# Patient Record
Sex: Male | Born: 1965
Health system: Southern US, Community
[De-identification: ages and names within clinical notes are randomized; demographics above are authoritative.]

## PROBLEM LIST (undated history)

## (undated) DIAGNOSIS — K219 Gastro-esophageal reflux disease without esophagitis: Secondary | ICD-10-CM

## (undated) DIAGNOSIS — K2950 Unspecified chronic gastritis without bleeding: Secondary | ICD-10-CM

## (undated) DIAGNOSIS — K449 Diaphragmatic hernia without obstruction or gangrene: Secondary | ICD-10-CM

## (undated) DIAGNOSIS — IMO0002 Reserved for concepts with insufficient information to code with codable children: Secondary | ICD-10-CM

## (undated) DIAGNOSIS — T7840XA Allergy, unspecified, initial encounter: Secondary | ICD-10-CM

## (undated) HISTORY — DX: Unspecified chronic gastritis without bleeding: K29.50

## (undated) HISTORY — DX: Diaphragmatic hernia without obstruction or gangrene: K44.9

## (undated) HISTORY — DX: Allergy, unspecified, initial encounter: T78.40XA

## (undated) HISTORY — DX: Reserved for concepts with insufficient information to code with codable children: IMO0002

## (undated) HISTORY — DX: Gastro-esophageal reflux disease without esophagitis: K21.9

## (undated) HISTORY — PX: UPPER GASTROINTESTINAL ENDOSCOPY: SHX188

---

## 1998-04-05 ENCOUNTER — Ambulatory Visit (HOSPITAL_COMMUNITY): Admission: RE | Admit: 1998-04-05 | Discharge: 1998-04-05 | Payer: Self-pay | Admitting: *Deleted

## 2002-07-21 ENCOUNTER — Ambulatory Visit (HOSPITAL_COMMUNITY): Admission: RE | Admit: 2002-07-21 | Discharge: 2002-07-21 | Payer: Self-pay | Admitting: Gastroenterology

## 2002-07-21 ENCOUNTER — Encounter: Payer: Self-pay | Admitting: Gastroenterology

## 2004-06-22 HISTORY — PX: BACK SURGERY: SHX140

## 2004-10-31 ENCOUNTER — Encounter
Admission: RE | Admit: 2004-10-31 | Discharge: 2004-10-31 | Payer: Self-pay | Admitting: Physical Medicine and Rehabilitation

## 2005-01-29 ENCOUNTER — Inpatient Hospital Stay (HOSPITAL_COMMUNITY): Admission: RE | Admit: 2005-01-29 | Discharge: 2005-02-01 | Payer: Self-pay | Admitting: Orthopaedic Surgery

## 2006-03-15 ENCOUNTER — Encounter: Admission: RE | Admit: 2006-03-15 | Discharge: 2006-03-15 | Payer: Self-pay | Admitting: Orthopaedic Surgery

## 2007-01-06 ENCOUNTER — Ambulatory Visit (HOSPITAL_COMMUNITY): Admission: RE | Admit: 2007-01-06 | Discharge: 2007-01-06 | Payer: Self-pay | Admitting: Family Medicine

## 2007-09-23 ENCOUNTER — Encounter: Admission: RE | Admit: 2007-09-23 | Discharge: 2007-09-23 | Payer: Self-pay | Admitting: Orthopaedic Surgery

## 2010-11-07 NOTE — Op Note (Signed)
NAMEAMARRI, SATTERLY               ACCOUNT NO.:  [id number]   MEDICAL RECORD NO.:  [id number]          PATIENT TYPE:  INP   LOCATION:  5041                         FACILITY:  [facility]   PHYSICIAN:  Sharolyn Douglas, M.D.        DATE OF BIRTH:  08-Sep-1965   DATE OF PROCEDURE:  01/29/2005  DATE OF DISCHARGE:                                 OPERATIVE REPORT   DIAGNOSIS:  L5-S1 degenerative disk disease, back and bilateral leg pain  left greater than right.   PROCEDURE:  1.  L5-S1 hemilaminotomy and diskectomy.  2.  Transforaminal lumbar interbody fusion L5-S1 placement of 9 mm PEEK      cage.  3.  Pedicle screw instrumentation L5-S1 using the Abbott spine system.  4.  Posterior spinal arthrodesis L5-S1.  5.  Local autogenous bone graft supplemented with bone morphogenic protein.   SURGEON:  Sharolyn Douglas, M.D.   ASSISTANT:  Verlin Fester, P.A.   ANESTHESIA:  General endotracheal.   ESTIMATED BLOOD LOSS:  200 mL.   COMPLICATIONS:  None.   INDICATIONS:  The patient is a pleasant 45 year old male with chronic  progressive back and leg pain. He has a radiographic studies including MRI,  plain x-rays and CT diskogram which demonstrate degenerative disk disease at  L5-S1 with concordant pain and lateral recess narrowing.  The superadjacent  segments are preserved. Because of his chronic progressive pain. He is  elected to undergo lumbar decompression and fusion in hopes of improving his  symptoms. Risk and benefits were reviewed.   PROCEDURE:  The patient was identified in the holding area and taken to the  operating room. He underwent general endotracheal anesthesia without  difficulty given prophylactic IV antibiotics. Carefully positioned prone on  the Wilson frame. All bony prominences padded. Face and eyes protected at  all times.  Back prepped, draped usual sterile fashion. Midline incision  made over the L4-S1 level. Dissection was carried sharply through deep  fascia. Paraspinal  muscles elevated out over the tips of the transverse  processes of L5 and sacral ala bilaterally. Deep retractors placed.  Intraoperative x-ray taken to confirm location. Hemilaminotomy was completed  on the left side by removing the inferior one third of the L5 lamina and  medial one third of the facet joint complex.  The ligamentum flavum was  removed piecemeal. The L5 and S1 nerve roots were identified.  The thecal  sac was mobilized medially. A focal disk herniation was identified. The disk  space was entered and the herniation was decompressed back into the  interspace and removed with a pituitary rongeur. The lateral recess was  decompressed flush with the L5 pedicle. We then turned our attention to  placing pedicle screws at L5 and S1 using anatomic probing technique. Each  pedicle starting point was found using the posterior anatomy. The pedicle  starting holes were initiated with the awl. Steffee pedicle probe used to  cannulate the pedicles. The pedicles were then tapped. We utilized 6.5 x 50  mm screws at L5, 7.5 x 30 mm screws in the sacrum bilaterally. Each pedicle  was palpated with a ball-tip feeler before placing the screws and there were  no breeches. The screw purchase was good. Each screw was then stimulated  using triggered EMGs and there were no deleterious changes. We then  completed the posterior arthrodesis by decorticating the transverse  processes of L5 and sacral ala bilaterally. Local bone graft obtained from  the laminotomy was cleaned and morselized. This was packed into the lateral  gutters. The L5-S1 foramen was palpated with the hockey-stick probe and it  was felt that there was an element of up down stenosis. In order to increase  the fusion rate and indirectly distract the foramen, it was elected to  proceed with transforaminal lumbar interbody fusion. The remaining facet  joint on the left side was osteotomized. The L5-S1 nerve roots were  protected all  times. Free running EMGs were monitored. The disk space was  entered and radical diskectomy was carried out across the contralateral  side. The cartilaginous endplates were scraped with curved curettes. The  disk space was distracted. Packed with local bone graft as well as BMP  sponges from the medium InFuse kit. 9 mm PEEK cage was then packed with a  BMP sponge inserted into the interspace, tamped anteriorly and across the  midline. We then placed short segment titanium rods into the polyaxial screw  heads. Gentle compression was applied before shearing off the locking caps.  The wound was irrigated. Hemostasis was achieved. Deep drain left in place.  Deep fascia closed with a running #1 Vicryl suture. Subcutaneous layer  closed with 2-0 Vicryl followed by a running 3-0 subcuticular Vicryl suture  on the skin edges. Benzoin, Steri-Strips placed. Sterile dressing applied.  The patient was turned supine, extubated without difficulty and transferred  to recovery in stable condition able to move his upper and lower  extremities.      Sharolyn Douglas, M.D.  Electronically Signed     MC/MEDQ  D:  01/29/2005  T:  01/30/2005  Job:  [id number]

## 2010-11-07 NOTE — Discharge Summary (Signed)
Jonathon Gray, Jonathon Gray               ACCOUNT NO.:  [id number]   MEDICAL RECORD NO.:  [id number]          PATIENT TYPE:  INP   LOCATION:  5041                         FACILITY:  [facility]   PHYSICIAN:  Sharolyn Douglas, M.D.        DATE OF BIRTH:  [birth year]   DATE OF ADMISSION:  01/29/2005  DATE OF DISCHARGE:  02/01/2005                                 DISCHARGE SUMMARY   ADMITTING DIAGNOSES:  L5-S1 degenerative disk disease with severe pain  refractory to conservative care.   DISCHARGE DIAGNOSES:  1.  Status post L5-S1 posterior spinal fusion with pedicle screws, doing      well.  2.  Postoperative blood loss anemia.  3.  Postoperative hyperglycemia.   PROCEDURE:  On January 29, 2005 patient was taken to the operating room for  an L5-S1 posterior spinal fusion with pedicle screws, TLIF and BMP.  Surgery  was done by Sharolyn Douglas, M.D., assistant [facility].  Anesthesia  was general.   CONSULTS:  None.   LABORATORIES:  Preoperatively CBC with differential was within normal  limits.  PT/INR and PTT within normal limits.  Complete metabolic panel  within normal limits.  Postoperatively H&H was monitored on a daily basis x3  days.  Did reach a low of 13.2 and 38.7 on August 13, however, asymptomatic  not requiring any treatment.  Basic metabolic panel x2 days postoperatively  was within normal limits with the exception of glucose of 139 on January 30, 2005 and of 130 on August 12 along with a BUN of 4 on January 31, 2005.  Blood typing from August 4 of 2006 was type O.  Rh type negative.  Antibody  screen negative.  EKG from August 4 showed sinus bradycardia with sinus  arrhythmia, minimal voltage criteria for LVH and AV normal variant.  No  previous tracing.  Read by Aggie Cosier, M.D.  X-rays:  Portable x-rays were  used intraoperatively for localization and placement of pedicle screws at L5  and S1 levels.   BRIEF HISTORY:  Patient is a 45 year old male who has had ongoing problems  related to his back and bilateral lower extremities.  He has tried numerous  conservative treatments recommended by Dr. Otelia Sergeant as well as Dr. Noel Gerold and  failed to improve for any length of time with these treatments.  He was  found to have L5-S1 significant degenerative changes as well as severe pain.  Diskogram confirmed as being a painful level.  Risks and benefits of L5-S1  fusion were discussed at length by Dr. Noel Gerold with the patient as well as his  wife.  He had understanding and opted to proceed with surgery.   HOSPITAL COURSE:  Patient was admitted on January 29, 2005 and taken to the  operating room for the above listed procedure.  He tolerated procedure well  without any intraoperative complications.  He was transferred to recovery  room in stable condition.  Intraoperative blood loss was approximately 200  mL.   Postoperatively routine orthopedic spine protocol was followed.  He  progressed along extremely well and did  not develop any significant  postoperative complications.   Physical therapy worked with him on a daily basis on his brace use, home  back precautions as well as ambulation, independence, and safety.  He  progressed along extremely well with them.   By February 01, 2005 patient had met all orthopedic goals.  He was stable and  independent with ambulation and ADLs.  He understood his brace use and back  precautions and demonstrated understanding of these.   DISCHARGE PLAN:  Patient is a 45 year old male status post L5-S1 posterior  spinal fusion doing well.   ACTIVITY:  Daily ambulation.  Brace on when he is on his feet.  Back  precautions at all times.  No lifting heavier than 5 pounds.  Dressing  changes daily.  Keep incision dry until all drainage stops at which time he  may resume showering.  Follow up two weeks postoperatively with Dr. Noel Gerold.  Diet is regular home diet as tolerated.   CONDITION ON DISCHARGE:  Stable, improved.   DISPOSITION:  Patient  being discharged to his home with his family's  assistance as well as home health physical therapy.      Verlin Fester, P.A.      Sharolyn Douglas, M.D.  Electronically Signed    CM/MEDQ  D:  05/20/2005  T:  05/20/2005  Job:  [id number]

## 2010-11-07 NOTE — H&P (Signed)
Jonathon Gray, Jonathon Gray               ACCOUNT NO.:  [id number]   MEDICAL RECORD NO.:  [id number]          PATIENT TYPE:  INP   LOCATION:  NA                           FACILITY:  [facility]   PHYSICIAN:  Sharolyn Douglas, M.D.        DATE OF BIRTH:  December 13, 1965   DATE OF ADMISSION:  01/29/2005  DATE OF DISCHARGE:                                HISTORY & PHYSICAL   CHIEF COMPLAINT:  Back and bilateral lower extremity pain.   HISTORY:  Patient is a 45 year old male who has been having increasing back  and bilateral lower extremity pain for several months.  He has tried  numerous successive conservative treatments that were recommended by Dr.  Otelia Sergeant as well as Dr. Noel Gerold and failed to improve for any length of time with  any of these.  He has undergone a diskogram which did indicate that he was  having pain from his L5-S1 disk.  His pain is interfering with activities of  daily living.  His quality of life has suffered significantly.  It is  thought the best course of management because of his testing as well as his  failure to improve with conservative management would be an L5-S1 posterior  spinal fusion procedure.  The risks and benefits of this were discussed with  him by Dr. Noel Gerold as well as myself.  He indicated understanding and opted to  proceed.   PAST MEDICAL HISTORY:  Healthy.   PAST SURGICAL HISTORY:  None.   ALLERGIES:  None.   MEDICATIONS:  Darvocet and Arthrotec.   REVIEW OF SYSTEMS:  Patient denies any fevers, chills, sweats, or bleeding  tendencies.  CNS:  Denies blurred vision, double vision, seizures, headache,  paralysis.  CARDIOVASCULAR:  Denies chest pain, angina, orthopnea,  claudication, palpitations.  PULMONARY:  Denies shortness of breath,  productive cough, or hemoptysis.  GI:  Denies nausea, vomiting,  constipation, diarrhea, melena, or bloody stool.  GU:  Denies dysuria,  hematuria, discharge.  MUSCULOSKELETAL:  As per HPI.   PHYSICAL EXAMINATION:  VITAL SIGNS:   Pulse is 62 and regular, respirations  16 and unlabored, blood pressure 120/76.  GENERAL:  Patient is a 45 year old white male who is alert and oriented, in  no acute distress.  Well-nourished, well-groomed.  Appears stated age.  Pleasant, cooperative on examination.  NECK:  Soft to palpation.  No lymphadenopathy or thyromegaly noted.  LUNGS:  Clear to auscultation bilaterally.  No rales, rhonchi, strider,  wheezing.  No friction rubs.  BREASTS:  Not pertinent.  Not performed.  HEART:  S1, S2.  Regular rate and rhythm.  No murmurs, gallops, rubs noted.  ABDOMEN:  Soft to palpation.  Nontender.  Nondistended.  No organomegaly  noted.  GENITOURINARY:  Not pertinent.  Not performed.  EXTREMITIES:  Bilateral lower extremity pain.  Motor function is intact.  Sensation is intact grossly.  SKIN:  Intact without any lesions or rashes.  NEUROLOGIC:  Intact grossly.   X-ray shows degenerative disk disease and MRI shows foraminal stenosis L4-L5-  S1.   IMPRESSION:  Degenerative disk disease and foraminal stenosis  L5-S1.   PLAN:  1.  Admit to [facility] on January 30, 2004 for an L5-S1 posterior      spinal fusion with pedicle screws.  This will be done by Dr. Sharolyn Douglas.  2.  Patient's primary care doctor is Dr. Renard Matter and has given the patient      verbal clearance.  We are still awaiting the written clearance from him.       CM/MEDQ  D:  01/26/2005  T:  01/26/2005  Job:  [id number]

## 2011-07-08 ENCOUNTER — Encounter (HOSPITAL_COMMUNITY): Payer: Self-pay | Admitting: Pharmacy Technician

## 2011-07-08 NOTE — Consult Note (Signed)
Jonathon Gray, Jonathon Gray               ACCOUNT NO.:  [id number]  MEDICAL RECORD NO.:  [id number]  LOCATION:                                 FACILITY:  PHYSICIAN:  Barbaraann Barthel, M.D. DATE OF BIRTH:  1966/05/20  DATE OF CONSULTATION:  07/07/2011 DATE OF DISCHARGE:                                CONSULTATION   DIAGNOSIS:  Left inguinal hernia.  HISTORY OF PRESENT MEDICAL ILLNESS:  The patient states that he was doing some heavy lifting and he felt a pull and a tear which was later followed by left groin pain and swelling in the area of the groin.  He was seen first by Dr. Renard Matter, who referred him to my office for repair of left inguinal hernia.  Physical examination revealed a non- incarcerated left inguinal hernia.  PAST MEDICAL HISTORY:  Fairly unremarkable.  PAST SURGICAL HISTORY:  He had an L5-S1 fusion in August of 2006.  For medications, see medication list.  ALLERGIES:  He has no known allergies.  SOCIAL HISTORY:  He does not smoke or drink.  PHYSICAL EXAMINATION:  VITAL SIGNS:  He is 5 feet 10 inches, weighs 180 pounds, his temperature is 97.5, pulse rate 60, respirations 12, blood pressure 110/70. HEAD:  Normocephalic. EYES:  Extraocular movements are intact.  Pupils were round reactive to light and accommodation.  There is no conjunctival pallor or scleral injection.  No adenopathy.  No bruits are auscultated. CHEST:  Clear, both to anterior and posterior auscultation. HEART:  Regular rhythm. ABDOMEN:  Soft.  The patient has a left inguinal hernia. GENITALIA:  Within normal limits. RECTAL:  Guaiac-negative stool.  Prostate is smooth without any nodules. EXTREMITIES:  Within normal limits.  REVIEW OF SYSTEMS:  NEUROLOGIC SYSTEM:  No history of migraines or seizures.  ENDOCRINE SYSTEM:  No history of diabetes or thyroid disease. CARDIOPULMONARY SYSTEM:  No shortness of breath, chest pain, or history of any myocardial infarction.  MUSCULOSKELETAL SYSTEM:  Recent  back surgery in 2006 with fusion of L5-L6, chronic back pain. GI SYSTEM:  No past history of hepatitis, constipation, diarrhea, bright red rectal bleeding, black tarry stools, inflammatory bowel disease, irritable bowel syndrome, or unexplained weight loss.  The patient has never undergone a colonoscopy. GU SYSTEM:  No history of frequency, dysuria, or nephrolithiasis.  DIAGNOSIS:  Therefore, left inguinal hernia.  PLAN:  Repair of left inguinal hernia via the outpatient department.  We have discussed surgery with him discussing complications not limited to, but including bleeding, infection, and recurrence, and  informed consent was obtained.     Barbaraann Barthel, M.D.     WB/MEDQ  D:  07/07/2011  T:  07/07/2011  Job:  [id number]  cc:   [name], MD Fax: [phone]  Short-Stay Department

## 2011-07-09 ENCOUNTER — Encounter (HOSPITAL_COMMUNITY): Admission: RE | Admit: 2011-07-09 | Source: Ambulatory Visit

## 2011-07-14 ENCOUNTER — Encounter (HOSPITAL_COMMUNITY): Payer: Self-pay

## 2011-07-14 ENCOUNTER — Encounter (HOSPITAL_COMMUNITY)
Admission: RE | Admit: 2011-07-14 | Discharge: 2011-07-14 | Disposition: A | Discharge status: Home / Self Care | Source: Ambulatory Visit | Attending: General Surgery | Admitting: General Surgery

## 2011-07-14 LAB — CBC
HCT: 42.5 % (ref 39.0–52.0)
MCV: 86.4 fL (ref 78.0–100.0)
Platelets: 241 10*3/uL (ref 150–400)
RBC: 4.92 MIL/uL (ref 4.22–5.81)
RDW: 11.9 % (ref 11.5–15.5)
WBC: 5.9 10*3/uL (ref 4.0–10.5)

## 2011-07-14 LAB — DIFFERENTIAL
Basophils Absolute: 0 10*3/uL (ref 0.0–0.1)
Eosinophils Relative: 2 % (ref 0–5)
Lymphocytes Relative: 31 % (ref 12–46)
Lymphs Abs: 1.8 10*3/uL (ref 0.7–4.0)
Monocytes Absolute: 0.5 10*3/uL (ref 0.1–1.0)
Neutro Abs: 3.5 10*3/uL (ref 1.7–7.7)

## 2011-07-14 LAB — BASIC METABOLIC PANEL
CO2: 32 mEq/L (ref 19–32)
Chloride: 102 mEq/L (ref 96–112)
Glucose, Bld: 96 mg/dL (ref 70–99)
Sodium: 139 mEq/L (ref 135–145)

## 2011-07-14 LAB — SURGICAL PCR SCREEN: Staphylococcus aureus: NEGATIVE

## 2011-07-14 NOTE — Patient Instructions (Addendum)
20 Jonathon Gray  07/14/2011   Your procedure is scheduled on:  07/20/11  Report to [facility] at 09:10 AM.  Call this number if you have problems the morning of surgery: [phone]   Remember:   Do not eat food:After Midnight.  May have clear liquids:until Midnight .  Clear liquids include soda, tea, black coffee, apple or grape juice, broth.  Take these medicines the morning of surgery with A SIP OF WATER: Meloxicam. Also, take your Hydrocodone only if needed.   Do not wear jewelry, make-up or nail polish.  Do not wear lotions, powders, or perfumes. You may wear deodorant.  Do not shave 48 hours prior to surgery.  Do not bring valuables to the hospital.  Contacts, dentures or bridgework may not be worn into surgery.  Leave suitcase in the car. After surgery it may be brought to your room.  For patients admitted to the hospital, checkout time is 11:00 AM the day of discharge.   Patients discharged the day of surgery will not be allowed to drive home.  Name and phone number of your driver:   Special Instructions: CHG Shower Use Special Wash: 1/2 bottle night before surgery and 1/2 bottle morning of surgery.   Please read over the following fact sheets that you were given: Pain Booklet, MRSA Information, Surgical Site Infection Prevention, Anesthesia Post-op Instructions and Care and Recovery After Surgery    Hernia, Surgical Repair A hernia occurs when an internal organ pushes out through a weak spot in the belly (abdominal) wall muscles. Hernias commonly occur in the groin and around the navel. Hernias often can be pushed back into place (reduced). Most hernias tend to get worse over time. Problems occur when abdominal contents get stuck in the opening (incarcerated hernia). The blood supply gets cut off (strangulated hernia). This is an emergency and needs surgery. Otherwise, hernia repair can be an elective procedure. This means you can schedule this at your convenience when an  emergency is not present. Because complications can occur, if you decide to repair the hernia, it is best to do it soon. When it becomes an emergency procedure, there is increased risk of complications after surgery. CAUSES   Heavy lifting.   Obesity.   Prolonged coughing.   Straining to move your bowels.   Hernias can also occur through a cut (incision) by a surgeonafter an abdominal operation.  HOME CARE INSTRUCTIONS Before the repair:  Bed rest is not required. You may continue your normal activities, but avoid heavy lifting (more than 10 pounds) or straining. Cough gently. If you are a smoker, it is best to stop. Even the best hernia repair can break down with the continual strain of coughing.   Do not wear anything tight over your hernia. Do not try to keep it in with an outside bandage or truss. These can damage abdominal contents if they are trapped in the hernia sac.   Eat a normal diet. Avoid constipation. Straining over long periods of time to have a bowel movement will increase hernia size. It also can breakdown repairs. If you cannot do this with diet alone, laxatives or stool softeners may be used.  PRIOR TO SURGERY, SEEK IMMEDIATE MEDICAL CARE IF: You have problems (symptoms) of a trapped (incarcerated) hernia. Symptoms include:  An oral temperature above 102 F (38.9 C) develops, or as your caregiver suggests.   Increasing abdominal pain.   Feeling sick to your stomach(nausea) and vomiting.   You stop passing gas  or stool.   The hernia is stuck outside the abdomen, looks discolored, feels hard, or is tender.   You have any changes in your bowel habits or in the hernia that is unusual for you.  LET YOUR CAREGIVERS KNOW ABOUT THE FOLLOWING:  Allergies.   Medications taken including herbs, eye drops, over the counter medications, and creams.   Use of steroids (by mouth or creams).   Family or personal history of problems with anesthetics or Novocaine.    Possibility of pregnancy, if this applies.   Personal history of blood clots (thrombophlebitis).   Family or personal history of bleeding or blood problems.   Previous surgery.   Other health problems.  BEFORE THE PROCEDURE You should be present 1 hour prior to your procedure, or as directed by your caregiver.  AFTER THE PROCEDURE After surgery, you will be taken to the recovery area. A nurse will watch and check your progress there. Once you are awake, stable, and taking fluids well, you will be allowed to go home as long as there are no problems. Once home, an ice pack (wrapped in a light towel) applied to your operative site may help with discomfort. It may also keep the swelling down. Do not lift anything heavier than 10 pounds (4.55 kilograms). Take showers not baths. Do not drive while taking narcotics. Follow instructions as suggested by your caregiver.  SEEK IMMEDIATE MEDICAL CARE IF: After surgery:  There is redness, swelling, or increasing pain in the wound.   There is pus coming from the wound.   There is drainage from a wound lasting longer than 1 day.   An unexplained oral temperature above 102 F (38.9 C) develops.   You notice a foul smell coming from the wound or dressing.   There is a breaking open of a wound (edged not staying together) after the sutures have been removed.   You notice increasing pain in the shoulders (shoulder strap areas).   You develop dizzy episodes or fainting while standing.   You develop persistent nausea or vomiting.   You develop a rash.   You have difficulty breathing.   You develop any reaction or side effects to medications given.  MAKE SURE YOU:   Understand these instructions.   Will watch your condition.   Will get help right away if you are not doing well or get worse.  Document Released: 12/02/2000 Document Revised: 02/18/2011 Document Reviewed: 10/25/2007 [facility] Patient Information 2012 [place], [place].

## 2011-07-20 ENCOUNTER — Ambulatory Visit (HOSPITAL_COMMUNITY)
Admission: RE | Admit: 2011-07-20 | Discharge: 2011-07-21 | Disposition: A | Discharge status: Home / Self Care | Source: Ambulatory Visit | Attending: General Surgery | Admitting: General Surgery

## 2011-07-20 ENCOUNTER — Encounter (HOSPITAL_COMMUNITY): Payer: Self-pay | Admitting: *Deleted

## 2011-07-20 ENCOUNTER — Other Ambulatory Visit: Payer: Self-pay | Admitting: General Surgery

## 2011-07-20 ENCOUNTER — Encounter (HOSPITAL_COMMUNITY)
Admission: RE | Disposition: A | Discharge status: Home / Self Care | Payer: Self-pay | Source: Ambulatory Visit | Attending: General Surgery

## 2011-07-20 ENCOUNTER — Encounter (HOSPITAL_COMMUNITY): Payer: Self-pay | Admitting: Anesthesiology

## 2011-07-20 ENCOUNTER — Ambulatory Visit (HOSPITAL_COMMUNITY): Admitting: Anesthesiology

## 2011-07-20 DIAGNOSIS — Z01812 Encounter for preprocedural laboratory examination: Secondary | ICD-10-CM | POA: Insufficient documentation

## 2011-07-20 DIAGNOSIS — K4090 Unilateral inguinal hernia, without obstruction or gangrene, not specified as recurrent: Secondary | ICD-10-CM | POA: Insufficient documentation

## 2011-07-20 DIAGNOSIS — D176 Benign lipomatous neoplasm of spermatic cord: Secondary | ICD-10-CM | POA: Insufficient documentation

## 2011-07-20 HISTORY — PX: INGUINAL HERNIA REPAIR: SHX194

## 2011-07-20 SURGERY — REPAIR, HERNIA, INGUINAL, ADULT
Anesthesia: General | Site: Abdomen | Laterality: Left | Wound class: Clean

## 2011-07-20 MED ORDER — ACETAMINOPHEN 650 MG RE SUPP: 650.0000 mg | Freq: Four times a day (QID) | RECTAL | Status: DC | PRN

## 2011-07-20 MED ORDER — MELOXICAM 7.5 MG PO TABS
15.0000 mg | ORAL_TABLET | Freq: Every day | ORAL | Status: DC
  Administered 2011-07-20: 15 mg via ORAL
  Filled 2011-07-20 (×2): qty 2

## 2011-07-20 MED ORDER — ONDANSETRON HCL 4 MG/2ML IJ SOLN: 4.0000 mg | Freq: Four times a day (QID) | INTRAMUSCULAR | Status: DC | PRN

## 2011-07-20 MED ORDER — ONDANSETRON HCL 4 MG/2ML IJ SOLN
INTRAMUSCULAR | Status: AC
  Administered 2011-07-20: 4 mg via INTRAVENOUS
  Filled 2011-07-20: qty 2

## 2011-07-20 MED ORDER — MIDAZOLAM HCL 5 MG/5ML IJ SOLN
INTRAMUSCULAR | Status: DC | PRN
  Administered 2011-07-20: 2 mg via INTRAVENOUS

## 2011-07-20 MED ORDER — CEFAZOLIN SODIUM 1-5 GM-% IV SOLN: 1.0000 g | INTRAVENOUS | Status: DC

## 2011-07-20 MED ORDER — MIDAZOLAM HCL 2 MG/2ML IJ SOLN
INTRAMUSCULAR | Status: AC
  Filled 2011-07-20: qty 2

## 2011-07-20 MED ORDER — LACTATED RINGERS IV SOLN
INTRAVENOUS | Status: DC
  Administered 2011-07-20: 11:00:00 via INTRAVENOUS

## 2011-07-20 MED ORDER — BUPIVACAINE HCL (PF) 0.5 % IJ SOLN
INTRAMUSCULAR | Status: DC | PRN
  Administered 2011-07-20: 13 mL

## 2011-07-20 MED ORDER — CEFAZOLIN SODIUM 1-5 GM-% IV SOLN
INTRAVENOUS | Status: AC
  Filled 2011-07-20: qty 50

## 2011-07-20 MED ORDER — ACETAMINOPHEN 325 MG PO TABS
650.0000 mg | ORAL_TABLET | Freq: Four times a day (QID) | ORAL | Status: DC | PRN
  Administered 2011-07-20: 650 mg via ORAL
  Filled 2011-07-20: qty 2

## 2011-07-20 MED ORDER — ONDANSETRON HCL 4 MG/2ML IJ SOLN
4.0000 mg | Freq: Once | INTRAMUSCULAR | Status: AC | PRN
  Administered 2011-07-20: 4 mg via INTRAVENOUS

## 2011-07-20 MED ORDER — PROPOFOL 10 MG/ML IV BOLUS
INTRAVENOUS | Status: DC | PRN
  Administered 2011-07-20: 150 mg via INTRAVENOUS

## 2011-07-20 MED ORDER — FENTANYL CITRATE 0.05 MG/ML IJ SOLN
INTRAMUSCULAR | Status: AC
  Administered 2011-07-20: 25 ug via INTRAVENOUS
  Filled 2011-07-20: qty 2

## 2011-07-20 MED ORDER — MORPHINE SULFATE 2 MG/ML IJ SOLN: 1.0000 mg | INTRAMUSCULAR | Status: DC | PRN

## 2011-07-20 MED ORDER — CEFAZOLIN SODIUM 1-5 GM-% IV SOLN
INTRAVENOUS | Status: DC | PRN
  Administered 2011-07-20: 1 g via INTRAVENOUS

## 2011-07-20 MED ORDER — MIDAZOLAM HCL 2 MG/2ML IJ SOLN
INTRAMUSCULAR | Status: AC
  Administered 2011-07-20: 2 mg via INTRAVENOUS
  Filled 2011-07-20: qty 2

## 2011-07-20 MED ORDER — LIDOCAINE HCL 1 % IJ SOLN
INTRAMUSCULAR | Status: DC | PRN
  Administered 2011-07-20: 25 mg via INTRADERMAL

## 2011-07-20 MED ORDER — FENTANYL CITRATE 0.05 MG/ML IJ SOLN
INTRAMUSCULAR | Status: DC | PRN
  Administered 2011-07-20: 25 ug via INTRAVENOUS
  Administered 2011-07-20 (×2): 50 ug via INTRAVENOUS

## 2011-07-20 MED ORDER — SODIUM CHLORIDE 0.9 % IR SOLN
Status: DC | PRN
  Administered 2011-07-20: 1000 mL

## 2011-07-20 MED ORDER — FENTANYL CITRATE 0.05 MG/ML IJ SOLN
25.0000 ug | INTRAMUSCULAR | Status: DC | PRN
  Administered 2011-07-20 (×2): 25 ug via INTRAVENOUS

## 2011-07-20 MED ORDER — POTASSIUM CHLORIDE IN NACL 20-0.9 MEQ/L-% IV SOLN
INTRAVENOUS | Status: DC
  Administered 2011-07-20 (×2): via INTRAVENOUS

## 2011-07-20 MED ORDER — FENTANYL CITRATE 0.05 MG/ML IJ SOLN
INTRAMUSCULAR | Status: AC
  Filled 2011-07-20: qty 2

## 2011-07-20 MED ORDER — DOCUSATE SODIUM 100 MG PO CAPS
100.0000 mg | ORAL_CAPSULE | Freq: Two times a day (BID) | ORAL | Status: DC
  Administered 2011-07-20 – 2011-07-21 (×3): 100 mg via ORAL
  Filled 2011-07-20 (×3): qty 1

## 2011-07-20 MED ORDER — BUPIVACAINE HCL (PF) 0.5 % IJ SOLN
INTRAMUSCULAR | Status: AC
  Filled 2011-07-20: qty 30

## 2011-07-20 MED ORDER — MIDAZOLAM HCL 2 MG/2ML IJ SOLN
1.0000 mg | INTRAMUSCULAR | Status: DC | PRN
  Administered 2011-07-20 (×2): 2 mg via INTRAVENOUS

## 2011-07-20 SURGICAL SUPPLY — 42 items
ATTRACTOMAT 16X20 MAGNETIC DRP (DRAPES) ×2 IMPLANT
BAG HAMPER (MISCELLANEOUS) ×2 IMPLANT
CLOTH BEACON ORANGE TIMEOUT ST (SAFETY) ×2 IMPLANT
COVER LIGHT HANDLE STERIS (MISCELLANEOUS) ×4 IMPLANT
DECANTER SPIKE VIAL GLASS SM (MISCELLANEOUS) ×2 IMPLANT
DRAIN PENROSE 12X.25 LTX STRL (MISCELLANEOUS) ×2 IMPLANT
DRESSING ALLEVYN BORDER HEEL (GAUZE/BANDAGES/DRESSINGS) ×2 IMPLANT
DRSG TEGADERM 2-3/8X2-3/4 SM (GAUZE/BANDAGES/DRESSINGS) ×2 IMPLANT
DRSG TEGADERM 4X4.75 (GAUZE/BANDAGES/DRESSINGS) ×2 IMPLANT
ELECT REM PT RETURN 9FT ADLT (ELECTROSURGICAL) ×2
ELECTRODE REM PT RTRN 9FT ADLT (ELECTROSURGICAL) ×1 IMPLANT
FORMALIN 10 PREFIL 120ML (MISCELLANEOUS) ×2 IMPLANT
GLOVE SKINSENSE NS SZ7.0 (GLOVE) ×1
GLOVE SKINSENSE STRL SZ7.0 (GLOVE) ×1 IMPLANT
GOWN STRL REIN XL XLG (GOWN DISPOSABLE) ×6 IMPLANT
INST SET MINOR GENERAL (KITS) ×2 IMPLANT
KIT ROOM TURNOVER APOR (KITS) ×2 IMPLANT
MANIFOLD NEPTUNE II (INSTRUMENTS) ×2 IMPLANT
NEEDLE HYPO 25X1 1.5 SAFETY (NEEDLE) ×2 IMPLANT
NS IRRIG 1000ML POUR BTL (IV SOLUTION) ×2 IMPLANT
PACK MINOR (CUSTOM PROCEDURE TRAY) ×2 IMPLANT
PAD ARMBOARD 7.5X6 YLW CONV (MISCELLANEOUS) ×2 IMPLANT
SET BASIN LINEN APH (SET/KITS/TRAYS/PACK) ×2 IMPLANT
SOL PREP PROV IODINE SCRUB 4OZ (MISCELLANEOUS) ×2 IMPLANT
SPONGE GAUZE 4X4 12PLY (GAUZE/BANDAGES/DRESSINGS) ×2 IMPLANT
SPONGE INTESTINAL PEANUT (DISPOSABLE) ×2 IMPLANT
SPONGE LAP 18X18 X RAY DECT (DISPOSABLE) ×2 IMPLANT
STAPLER VISISTAT 35W (STAPLE) ×2 IMPLANT
SUT NOVA NAB GS-22 2 2-0 T-19 (SUTURE) IMPLANT
SUT NUROLON NAB CT 2 2-0 18IN (SUTURE) ×2 IMPLANT
SUT PROLENE 0 CT 1 CR/8 (SUTURE) IMPLANT
SUT SILK 2 0 (SUTURE) ×1
SUT SILK 2-0 18XBRD TIE 12 (SUTURE) ×1 IMPLANT
SUT VIC AB 0 CT1 27 (SUTURE) ×1
SUT VIC AB 0 CT1 27XBRD ANTBC (SUTURE) ×1 IMPLANT
SUT VIC AB 3-0 SH 27 (SUTURE) ×1
SUT VIC AB 3-0 SH 27X BRD (SUTURE) ×1 IMPLANT
SUT VICRYL AB 3 0 TIES (SUTURE) ×2 IMPLANT
SYR BULB IRRIGATION 50ML (SYRINGE) ×2 IMPLANT
SYR CONTROL 10ML LL (SYRINGE) ×2 IMPLANT
TRAY FOLEY CATH 14FR (SET/KITS/TRAYS/PACK) ×2 IMPLANT
WATER STERILE IRR 1000ML POUR (IV SOLUTION) ×4 IMPLANT

## 2011-07-20 NOTE — Anesthesia Postprocedure Evaluation (Signed)
  Anesthesia Post-op Note  Patient: Jonathon Gray  Procedure(s) Performed:  HERNIA REPAIR INGUINAL ADULT - inguinal area  Patient Location: PACU  Anesthesia Type: General  Level of Consciousness: awake, alert , oriented and patient cooperative  Airway and Oxygen Therapy: Patient Spontanous Breathing  Post-op Pain: 3 /10, mild  Post-op Assessment: Post-op Vital signs reviewed, Patient's Cardiovascular Status Stable, Respiratory Function Stable, Patent Airway and No signs of Nausea or vomiting  Post-op Vital Signs: Reviewed and stable  Complications: No apparent anesthesia complications

## 2011-07-20 NOTE — Brief Op Note (Signed)
07/20/2011  12:36 PM  PATIENT:  Jonathon Gray  46 y.o. male  PRE-OPERATIVE DIAGNOSIS:  left inguinal hernia  POST-OPERATIVE DIAGNOSIS:  left inguinal hernia  PROCEDURE:  Procedure(s): HERNIA REPAIR INGUINAL ADULT  SURGEON:  Surgeon(s): Marlane Hatcher, MD  PHYSICIAN ASSISTANT:   ASSISTANTS: none   ANESTHESIA:   general  EBL:  Total I/O In: 750 [I.V.:750] Out: 300 [Urine:300]  BLOOD ADMINISTERED:none  DRAINS: none   LOCAL MEDICATIONS USED:  MARCAINE 13 CC  SPECIMEN:  Source of Specimen:  left inguinal hernia sac and lipoma of cord.  DISPOSITION OF SPECIMEN:  PATHOLOGY  COUNTS:  YES  TOURNIQUET:  * No tourniquets in log *  DICTATION: .Other Dictation: Dictation Number or dict. # [id number].  PLAN OF CARE: Admit for overnight observation  PATIENT DISPOSITION:  PACU - hemodynamically stable.   Delay start of Pharmacological VTE agent (>24hrs) due to surgical blood loss or risk of bleeding:  {YES/NO/NOT APPLICABLE:20182

## 2011-07-20 NOTE — Progress Notes (Signed)
07/20/11 1800 Patient ambulated in hallway with nurse tech this evening, tolerated fairly well. Nursing to monitor and progress activity per dr bradford order.

## 2011-07-20 NOTE — Progress Notes (Signed)
Post OP Check  Awake and alert.  Some incisional pain but otherwise doing well except for post anesthesia nausea.   Pt. Has voided.  Wound clean and dry.  Discharge in AM.  Filed Vitals:   07/20/11 1445  BP: 124/68  Pulse: 69  Temp: 97.6 F (36.4 C)  Resp: 18     Doing well post op.

## 2011-07-20 NOTE — Anesthesia Procedure Notes (Signed)
Procedure Name: LMA Insertion Date/Time: 07/20/2011 11:25 AM Performed by: Despina Hidden Pre-anesthesia Checklist: Patient being monitored, Patient identified, Emergency Drugs available and Suction available Patient Re-evaluated:Patient Re-evaluated prior to inductionOxygen Delivery Method: Circle System Utilized Preoxygenation: Pre-oxygenation with 100% oxygen Intubation Type: IV induction Ventilation: Mask ventilation without difficulty LMA Size: 4.0 Number of attempts: 1 Tube secured with: Tape Dental Injury: Teeth and Oropharynx as per pre-operative assessment

## 2011-07-20 NOTE — Progress Notes (Signed)
46 yr. Old W. Male for elective repair of Left inguinal hernia.  No clinical changes since H&P.  Labs reviewed and site marked and all questions and consent addressed.  H&P dict. [id number].

## 2011-07-20 NOTE — Progress Notes (Addendum)
PRE OP UP DATE  There were no vitals filed for this visit.  127/84 HR 72  RESP 20  O2 Sat 95%  46 yr old W male for excision of ganglion cyst of Right wrist ANT. aspect.  Procedure and risks explained and informed consent obtained and operative site marked and labs reviewed.  No clinical changes from time of H&P.   Please note that H&P verification that mentioned ganglion cst of Right wrist pertains to another pt.

## 2011-07-20 NOTE — Op Note (Signed)
Jonathon Gray, Jonathon Gray               ACCOUNT NO.:  [id number]  MEDICAL RECORD NO.:  [id number]  LOCATION:  [facility]                          FACILITY:  [facility]  PHYSICIAN:  Barbaraann Barthel, M.D. DATE OF BIRTH:  22-Oct-1965  DATE OF PROCEDURE:  07/20/2011 DATE OF DISCHARGE:                              OPERATIVE REPORT   DIAGNOSIS:  Left inguinal hernia (direct and indirect defect).  PROCEDURE:  Left inguinal hernia repair (modified McVay repair without mesh).  SPECIMEN:  Lipoma of the cord with a left inguinal hernia sac.  NOTE:  This is a 46 year old white male, who did some heavy lifting and felt a pull in his left groin, and he was seen by the Medical Service and referred to my office, where a left inguinal hernia was confirmed. We planned to take him to surgery electively.  We discussed preoperatively the complications, not limited to, but including bleeding, infection, and recurrence and informed consent was obtained.  FINDINGS:  Those consistent with a left inguinal hernia, sac and a direct defect.  WOUND CLASSIFICATION:  Clean.  TECHNIQUE:  The patient was placed in the supine position.  After the adequate administration of general anesthesia, he was prepped with Betadine solution and draped in usual manner.  Prior to this, a Foley catheter was aseptically inserted.  An incision was carried out between the anterior and superior iliac spine and the pubic tubercle through skin, subcutaneous tissue and Scarpa layer, down to the external oblique, which was opened through the external ring.  The cord structures were then separated.  There was a very tenuous superficial nerve.  I do not know if this was the ilioinguinal nerve, but it was very small nerve.  This was divided when we retracted the cord structures.  The cord structures were then dissected free from a large lipoma of the cord, which was clamped and ligated with 2-0 silk.  We then dissected the hernia sac from the cord  structures as well.  The sac was opened and ligated with 2-0 Bralon suture, and then we closed the direct defect by suturing transversus abdominis and transversalis fascia to Cooper's ligament and Poupart's ligament with interrupted 2-0 Bralon sutures.  Prior to cinching these tightly, the relaxing incision was carried out.  We then returned the cord structures to their anatomic position.  I used approximately 13 mL of 0.5% Marcaine without epinephrine for postoperative comfort, and then repaired the external oblique over the cord structures using a running 3-0 Vicryl suture.  The wound was irrigated with normal saline solution.  The skin was approximated with a stapling device.  Prior to closure, all sponge, needle, and instrument counts were found to be correct.  ESTIMATED BLOOD LOSS:  Minimal.  The patient tolerated the procedure well.  No drains were placed and there were no complications.     Barbaraann Barthel, M.D.     WB/MEDQ  D:  07/20/2011  T:  07/20/2011  Job:  [id number]  cc:   [name], MD Fax: [phone]

## 2011-07-20 NOTE — Transfer of Care (Signed)
Immediate Anesthesia Transfer of Care Note  Patient: Jonathon Gray  Procedure(s) Performed:  HERNIA REPAIR INGUINAL ADULT - inguinal area  Patient Location: PACU  Anesthesia Type: General  Level of Consciousness: awake  Airway & Oxygen Therapy: Patient Spontanous Breathing and Patient connected to face mask oxygen  Post-op Assessment: Report given to PACU RN, Post -op Vital signs reviewed and stable and Patient moving all extremities X 4  Post vital signs: Reviewed and stable  Complications: No apparent anesthesia complications

## 2011-07-20 NOTE — Anesthesia Preprocedure Evaluation (Addendum)
Anesthesia Evaluation  Patient identified by MRN, date of birth, ID band Patient awake    Reviewed: Allergy & Precautions, H&P , NPO status , Patient's Chart, lab work & pertinent test results  History of Anesthesia Complications Negative for: history of anesthetic complications  Airway Mallampati: II      Dental  (+) Teeth Intact   Pulmonary neg pulmonary ROS,    Pulmonary exam normal       Cardiovascular neg cardio ROS Regular Normal    Neuro/Psych    GI/Hepatic   Endo/Other    Renal/GU      Musculoskeletal   Abdominal   Peds  Hematology   Anesthesia Other Findings   Reproductive/Obstetrics                           Anesthesia Physical Anesthesia Plan  ASA: I  Anesthesia Plan: General   Post-op Pain Management:    Induction: Intravenous  Airway Management Planned: LMA  Additional Equipment:   Intra-op Plan:   Post-operative Plan:   Informed Consent: I have reviewed the patients History and Physical, chart, labs and discussed the procedure including the risks, benefits and alternatives for the proposed anesthesia with the patient or authorized representative who has indicated his/her understanding and acceptance.     Plan Discussed with:   Anesthesia Plan Comments:         Anesthesia Quick Evaluation

## 2011-07-21 MED ORDER — HYDROCODONE-ACETAMINOPHEN 5-500 MG PO TABS: 1.0000 | ORAL_TABLET | Freq: Four times a day (QID) | ORAL | Status: DC | PRN

## 2011-07-21 MED ORDER — DSS 100 MG PO CAPS: 100.0000 mg | ORAL_CAPSULE | Freq: Two times a day (BID) | ORAL | Status: AC

## 2011-07-21 NOTE — Progress Notes (Signed)
POD # 1  Filed Vitals:   07/21/11 0555  BP: 108/61  Pulse: 84  Temp: 98.4 F (36.9 C)  Resp: 20    Pt. Very comfortable with minimal pain.  No dysuria, no leg pain or shortness of breath.  Wound very clean and redressed.  Discharge and follow up arranged.  Discharge dict. # [id number].

## 2011-07-21 NOTE — Addendum Note (Signed)
Addendum  created 07/21/11 0454 by Despina Hidden, CRNA   Modules edited:Notes Section

## 2011-07-21 NOTE — Progress Notes (Signed)
07/21/11 1057 Patient being discharged home. IV site d/c'd and within normal limits per Knute Neu, RN. Reviewed discharge instructions with patient and wife, given copy of instructions, med list, carenotes, f/u appointment information. Verbalized understanding. Pt denies pain or discomfort at this time, incision clean and dry with new dressing as applied per Dr Malvin Johns this am. Pt left floor in stable condition via w/c accompanied by staff member.

## 2011-07-21 NOTE — Anesthesia Postprocedure Evaluation (Signed)
  Anesthesia Post-op Note  Patient: Jonathon Gray  Procedure(s) Performed:  HERNIA REPAIR INGUINAL ADULT - inguinal area  Patient Location: room 309  Anesthesia Type: General  Level of Consciousness: awake, alert , oriented and patient cooperative  Airway and Oxygen Therapy: Patient Spontanous Breathing  Post-op Pain: none  Post-op Assessment: Post-op Vital signs reviewed, Patient's Cardiovascular Status Stable, Respiratory Function Stable, Patent Airway, No signs of Nausea or vomiting, NAUSEA AND VOMITING PRESENT, Adequate PO intake and Pain level controlled  Post-op Vital Signs: Reviewed and stable  Complications: No apparent anesthesia complications

## 2011-07-21 NOTE — Discharge Summary (Signed)
[name], Jonathon Gray               ACCOUNT NO.:  [id number]  MEDICAL RECORD NO.:  [id number]  LOCATION:  A309                          FACILITY:  [facility]  PHYSICIAN:  Barbaraann Barthel, M.D. DATE OF BIRTH:  1965/06/25  DATE OF ADMISSION:  07/20/2011 DATE OF DISCHARGE:  01/29/2013LH                              DISCHARGE SUMMARY   DIAGNOSIS:  Left inguinal hernia.  PROCEDURE:  On July 20, 2011, left inguinal herniorrhaphy (modified McVay repair without mesh).  NOTE:  This is a 46 year old white male who was admitted via the outpatient department for elective repair of left inguinal hernia.  This was done uneventfully and the patient had been into the hospital for observation, and he was discharged less than 24 hours after the surgery. During his hospital stay, there were no problems.  He had a completely uncomplicated postoperative course.  At the time of discharge, his wound was clean and he had no dysuria, leg pain, or shortness of breath, and we had made arrangements for followup with him perioperatively.  After this, he is to return to Dr. Renard Matter for any medical problems he may have in the future.     Barbaraann Barthel, M.D.     WB/MEDQ  D:  07/21/2011  T:  07/21/2011  Job:  [id number]  cc:   [name], MD Fax: [phone]

## 2011-07-23 ENCOUNTER — Encounter (HOSPITAL_COMMUNITY): Payer: Self-pay | Admitting: General Surgery

## 2012-04-13 ENCOUNTER — Ambulatory Visit (INDEPENDENT_AMBULATORY_CARE_PROVIDER_SITE_OTHER): Payer: Federal, State, Local not specified - PPO | Admitting: Gastroenterology

## 2012-04-13 ENCOUNTER — Encounter: Payer: Self-pay | Admitting: Gastroenterology

## 2012-04-13 VITALS — BP 128/64 | HR 60 | Ht 70.0 in | Wt 184.0 lb

## 2012-04-13 DIAGNOSIS — R1013 Epigastric pain: Secondary | ICD-10-CM

## 2012-04-13 NOTE — Patient Instructions (Addendum)
Upper GI Endoscopy Upper GI endoscopy means using a flexible scope to look at the esophagus, stomach, and upper small bowel. This is done to make a diagnosis in people with heartburn, abdominal pain, or abnormal bleeding. Sometimes an endoscope is needed to remove foreign bodies or food that become stuck in the esophagus; it can also be used to take biopsy samples. For the best results, do not eat or drink for 8 hours before having your upper endoscopy.  To perform the endoscopy, you will probably be sedated and your throat will be numbed with a special spray. The endoscope is then slowly passed down your throat (this will not interfere with your breathing). An endoscopy exam takes 15 to 30 minutes to complete and there is no real pain. Patients rarely remember much about the procedure. The results of the test may take several days if a biopsy or other test is taken.  You may have a sore throat after an endoscopy exam. Serious complications are very rare. Stick to liquids and soft foods until your pain is better. Do not drive a car or operate any dangerous equipment for at least 24 hours after being sedated. SEEK IMMEDIATE MEDICAL CARE IF:   You have severe throat pain.   You have shortness of breath.   You have bleeding problems.   You have a fever.   You have difficulty recovering from your sedation.  Document Released: 07/16/2004 Document Revised: 05/28/2011 Document Reviewed: 06/10/2008 [employer] Patient Information 2012 [employer]. 

## 2012-04-13 NOTE — Assessment & Plan Note (Addendum)
3 week history of burning abdominal pain in the setting of chronic NSAID use. I suspect that symptoms are related to this medicine. He may have also or non-ulcer dyspepsia.  Recommendations #1 discontinue lodine #2 continue protonix #3 upper endoscopy

## 2012-04-13 NOTE — Progress Notes (Signed)
History of Present Illness: 46 year old white male referred at the request of Dr. Renard Matter for evaluation of abdominal pain. For the last 3 weeks she's been complaining of moderately severe burning upper epigastric pain. Pain is exacerbated by eating. He complains of postprandial nausea. He denies pyrosis or dysphagia. It has not improved despite taking Protonix. Patient has been taking Lodine for approximately 3 months.   Past Medical History  Diagnosis Date  . Hiatal hernia   . Chronic gastritis   . GERD (gastroesophageal reflux disease)    Past Surgical History  Procedure Date  . Back surgery 2006    l5 s1 fusion  . Inguinal hernia repair 07/20/2011    Procedure: HERNIA REPAIR INGUINAL ADULT;  Surgeon: Marlane Hatcher, MD;  Location: [facility];  Service: General;  Laterality: Left;  inguinal area   family history includes Heart disease in his father.  There is no history of Anesthesia problems, and Hypotension, and Malignant hyperthermia, and Pseudochol deficiency, . Current Outpatient Prescriptions  Medication Sig Dispense Refill  . etodolac (LODINE) 400 MG tablet Take 400 mg by mouth 2 (two) times daily.      Marland Kitchen HYDROcodone-acetaminophen (VICODIN) 5-500 MG per tablet Take 1 tablet by mouth every 6 (six) hours as needed for pain. For pain  30 tablet  0  . pantoprazole (PROTONIX) 40 MG tablet Takes one capsule by mouth once daily       Allergies as of 04/13/2012  . (No Known Allergies)    reports that he has never smoked. He has never used smokeless tobacco. He reports that he does not drink alcohol or use illicit drugs.     Review of Systems: He complains of persistent back and neck pain   Pertinent positive and negative review of systems were noted in the above HPI section. All other review of systems were otherwise negative.  Vital signs were reviewed in today's medical record Physical Exam: General: Well developed , well nourished, no acute distress Head: Normocephalic and  atraumatic Eyes:  sclerae anicteric, EOMI Ears: Normal auditory acuity Mouth: No deformity or lesions Neck: Supple, no masses or thyromegaly Lungs: Clear throughout to auscultation Heart: Regular rate and rhythm; no murmurs, rubs or bruits Abdomen: Soft,  and non distended. No masses, hepatosplenomegaly or hernias noted. Normal Bowel sounds. There is mild tenderness to palpation in the upper mid epigastrium Rectal:deferred Musculoskeletal: Symmetrical with no gross deformities  Skin: No lesions on visible extremities Pulses:  Normal pulses noted Extremities: No clubbing, cyanosis, edema or deformities noted Neurological: Alert oriented x 4, grossly nonfocal Cervical Nodes:  No significant cervical adenopathy Inguinal Nodes: No significant inguinal adenopathy Psychological:  Alert and cooperative. Normal mood and affect

## 2012-04-14 ENCOUNTER — Ambulatory Visit (AMBULATORY_SURGERY_CENTER): Payer: Federal, State, Local not specified - PPO | Admitting: Gastroenterology

## 2012-04-14 ENCOUNTER — Encounter: Payer: Self-pay | Admitting: Gastroenterology

## 2012-04-14 VITALS — BP 105/42 | HR 67 | Temp 96.2°F | Resp 22 | Ht 70.0 in | Wt 184.0 lb

## 2012-04-14 DIAGNOSIS — D131 Benign neoplasm of stomach: Secondary | ICD-10-CM

## 2012-04-14 DIAGNOSIS — R1013 Epigastric pain: Secondary | ICD-10-CM

## 2012-04-14 MED ORDER — SODIUM CHLORIDE 0.9 % IV SOLN: 4.0000 mg | Freq: Once | INTRAVENOUS | Status: DC

## 2012-04-14 MED ORDER — SODIUM CHLORIDE 0.9 % IV SOLN: 500.0000 mL | INTRAVENOUS | Status: DC

## 2012-04-14 NOTE — Progress Notes (Signed)
Pt states that he is very nauseated this a.m.

## 2012-04-14 NOTE — Patient Instructions (Addendum)
Hold lodine.YOU HAD AN ENDOSCOPIC PROCEDURE TODAY AT THE [facility]: Refer to the procedure report that was given to you for any specific questions about what was found during the examination.  If the procedure report does not answer your questions, please call your gastroenterologist to clarify.  If you requested that your care partner not be given the details of your procedure findings, then the procedure report has been included in a sealed envelope for you to review at your convenience later.   DIET: Your first meal following the procedure should be a light meal and then it is ok to progress to your normal diet.  A half-sandwich or bowl of soup is an example of a good first meal.  Heavy or fried foods are harder to digest and may make you feel nauseous or bloated.  Likewise meals heavy in dairy and vegetables can cause extra gas to form and this can also increase the bloating.  Drink plenty of fluids but you should avoid alcoholic beverages for 24 hours.  ACTIVITY: Your care partner should take you home directly after the procedure.  You should plan to take it easy, moving slowly for the rest of the day.  You can resume normal activity the day after the procedure however you should NOT DRIVE or use heavy machinery for 24 hours (because of the sedation medicines used during the test).    SYMPTOMS TO REPORT IMMEDIATELY: A gastroenterologist can be reached at any hour.  During normal business hours, 8:30 AM to 5:00 PM Monday through Friday, call [phone].  After hours and on weekends, please call the GI answering service at [phone] who will take a message and have the physician on call contact you.   Following upper endoscopy (EGD)  Vomiting of blood or coffee ground material  New chest pain or pain under the shoulder blades  Painful or persistently difficult swallowing  New shortness of breath  Fever of 100F or higher  Black, tarry-looking stools  FOLLOW UP: If any  biopsies were taken you will be contacted by phone or by letter within the next 1-3 weeks.  Call your gastroenterologist if you have not heard about the biopsies in 3 weeks.  Our staff will call the home number listed on your records the next business day following your procedure to check on you and address any questions or concerns that you may have at that time regarding the information given to you following your procedure. This is a courtesy call and so if there is no answer at the home number and we have not heard from you through the emergency physician on call, we will assume that you have returned to your regular daily activities without incident.  SIGNATURES/CONFIDENTIALITY: You and/or your care partner have signed paperwork which will be entered into your electronic medical record.  These signatures attest to the fact that that the information above on your After Visit Summary has been reviewed and is understood.  Full responsibility of the confidentiality of this discharge information lies with you and/or your care-partner.

## 2012-04-14 NOTE — Op Note (Signed)
[facility] [address] [place], [address]   ENDOSCOPY PROCEDURE REPORT  PATIENT: Jonathon Gray, Jonathon Gray  MR#: [id number] BIRTHDATE: January 31, 1966 , 46  yrs. old GENDER: Male ENDOSCOPIST: Louis Meckel, MD REFERRED BY:  Butch Penny, M.D. PROCEDURE DATE:  04/14/2012 PROCEDURE:  EGD, diagnostic ASA CLASS:     Class II INDICATIONS: MEDICATIONS: MAC sedation, administered by CRNA and propofol (Diprivan) 200mg  IV TOPICAL ANESTHETIC:  DESCRIPTION OF PROCEDURE: After the risks benefits and alternatives of the procedure were thoroughly explained, informed consent was obtained.  The [facility] [id number] endoscope was introduced through the mouth and advanced to the third portion of the duodenum. Without limitations.  The instrument was slowly withdrawn as the mucosa was fully examined.      STOMACH: Multiple flat polyps were found.    in the gastric body and fundus there were  multiple  3 mm fundic  appearing polyps  The remainder of the upper endoscopy exam was otherwise normal. Retroflexed views revealed no abnormalities.     The scope was then withdrawn from the patient and the procedure completed.  COMPLICATIONS: There were no complications. ENDOSCOPIC IMPRESSION: 1.   Fundic gland polyps 2.   The remainder of the upper endoscopy exam was otherwise normal  RECOMMENDATIONS: hold lodine OV 1 month  REPEAT EXAM:  eSigned:  Louis Meckel, MD 04/14/2012 8:57 AM   CC:

## 2012-04-14 NOTE — Progress Notes (Addendum)
Pt c/o nausea; states this is not new.  Dr. Arlyce Dice notified and order received to give Zofran 4 mg IV once.  Verified medication and dose with Laverna Peace RN and medication given over directed time  [phone] denies any further nausea at this time

## 2012-04-14 NOTE — Progress Notes (Signed)
Patient did not have preoperative order for IV antibiotic SSI prophylaxis. (G8918)  Patient did not experience any of the following events: a burn prior to discharge; a fall within the facility; wrong site/side/patient/procedure/implant event; or a hospital transfer or hospital admission upon discharge from the facility. (G8907)  

## 2012-04-15 ENCOUNTER — Telehealth: Payer: Self-pay | Admitting: *Deleted

## 2012-04-15 NOTE — Telephone Encounter (Signed)
  Follow up Call-  Call back number 04/14/2012  Post procedure Call Back phone  # [phone]  Permission to leave phone message Yes     Patient questions:  Do you have a fever, pain , or abdominal swelling? no Pain Score  0 *  Have you tolerated food without any problems? yes  Have you been able to return to your normal activities? yes  Do you have any questions about your discharge instructions: Diet   no Medications  no Follow up visit  no  Do you have questions or concerns about your Care? no  Actions: * If pain score is 4 or above: No action needed, pain <4.

## 2012-04-21 ENCOUNTER — Telehealth: Payer: Self-pay | Admitting: Gastroenterology

## 2012-04-21 NOTE — Telephone Encounter (Signed)
Left message for pt to call back  °

## 2012-04-22 NOTE — Telephone Encounter (Signed)
Pt states he is still having problems with Nausea when he eats. Needs a note to be out of work through 04/23/12. Letter mailed to pt.

## 2012-05-17 ENCOUNTER — Ambulatory Visit: Payer: Federal, State, Local not specified - PPO | Admitting: Gastroenterology

## 2014-06-01 ENCOUNTER — Emergency Department (HOSPITAL_COMMUNITY)
Admission: EM | Admit: 2014-06-01 | Discharge: 2014-06-01 | Disposition: A | Discharge status: Home / Self Care | Payer: Federal, State, Local not specified - PPO | Attending: Emergency Medicine | Admitting: Emergency Medicine

## 2014-06-01 ENCOUNTER — Encounter (HOSPITAL_COMMUNITY): Payer: Self-pay | Admitting: Emergency Medicine

## 2014-06-01 DIAGNOSIS — Z79899 Other long term (current) drug therapy: Secondary | ICD-10-CM | POA: Insufficient documentation

## 2014-06-01 DIAGNOSIS — K219 Gastro-esophageal reflux disease without esophagitis: Secondary | ICD-10-CM | POA: Insufficient documentation

## 2014-06-01 DIAGNOSIS — Z23 Encounter for immunization: Secondary | ICD-10-CM | POA: Diagnosis not present

## 2014-06-01 DIAGNOSIS — W540XXA Bitten by dog, initial encounter: Secondary | ICD-10-CM | POA: Insufficient documentation

## 2014-06-01 DIAGNOSIS — Y998 Other external cause status: Secondary | ICD-10-CM | POA: Diagnosis not present

## 2014-06-01 DIAGNOSIS — Y929 Unspecified place or not applicable: Secondary | ICD-10-CM | POA: Insufficient documentation

## 2014-06-01 DIAGNOSIS — M549 Dorsalgia, unspecified: Secondary | ICD-10-CM | POA: Insufficient documentation

## 2014-06-01 DIAGNOSIS — Z872 Personal history of diseases of the skin and subcutaneous tissue: Secondary | ICD-10-CM | POA: Insufficient documentation

## 2014-06-01 DIAGNOSIS — Y9389 Activity, other specified: Secondary | ICD-10-CM | POA: Insufficient documentation

## 2014-06-01 DIAGNOSIS — S51852A Open bite of left forearm, initial encounter: Secondary | ICD-10-CM | POA: Insufficient documentation

## 2014-06-01 MED ORDER — RABIES VACCINE, PCEC IM SUSR
1.0000 mL | Freq: Once | INTRAMUSCULAR | Status: DC
  Filled 2014-06-01: qty 1

## 2014-06-01 MED ORDER — HYDROCODONE-ACETAMINOPHEN 5-325 MG PO TABS: 1.0000 | ORAL_TABLET | ORAL | Status: AC | PRN

## 2014-06-01 MED ORDER — TETANUS-DIPHTH-ACELL PERTUSSIS 5-2.5-18.5 LF-MCG/0.5 IM SUSP
0.5000 mL | Freq: Once | INTRAMUSCULAR | Status: AC
  Administered 2014-06-01: 0.5 mL via INTRAMUSCULAR
  Filled 2014-06-01: qty 0.5

## 2014-06-01 MED ORDER — BACITRACIN-NEOMYCIN-POLYMYXIN OINTMENT TUBE
TOPICAL_OINTMENT | Freq: Once | CUTANEOUS | Status: AC
  Administered 2014-06-01: 20:00:00 via TOPICAL
  Filled 2014-06-01: qty 1
  Filled 2014-06-01: qty 15

## 2014-06-01 MED ORDER — RABIES IMMUNE GLOBULIN 150 UNIT/ML IM INJ
INJECTION | INTRAMUSCULAR | Status: AC
  Filled 2014-06-01: qty 10

## 2014-06-01 MED ORDER — RABIES IMMUNE GLOBULIN 150 UNIT/ML IM INJ
20.0000 [IU]/kg | INJECTION | Freq: Once | INTRAMUSCULAR | Status: DC
  Filled 2014-06-01: qty 10

## 2014-06-01 MED ORDER — AMOXICILLIN-POT CLAVULANATE 875-125 MG PO TABS: 1.0000 | ORAL_TABLET | Freq: Two times a day (BID) | ORAL | Status: DC

## 2014-06-01 MED ORDER — AMOXICILLIN-POT CLAVULANATE 875-125 MG PO TABS
1.0000 | ORAL_TABLET | Freq: Once | ORAL | Status: AC
  Administered 2014-06-01: 1 via ORAL
  Filled 2014-06-01: qty 1

## 2014-06-01 NOTE — ED Notes (Signed)
[employer] department contacted per protocol. Dispatch stated they will send someone out.

## 2014-06-01 NOTE — Discharge Instructions (Signed)
Please cleanse your wound daily with soap and water, and apply a Neosporin bandage until healed. Please use Augmentin 2 times daily with food until all taken. If you should change your mind about the rabies vaccine, please see your primary physician for prescription and referral. Animal Bite An animal bite can result in a scratch on the skin, deep open cut, puncture of the skin, crush injury, or tearing away of the skin or a body part. Dogs are responsible for most animal bites. Children are bitten more often than adults. An animal bite can range from very mild to more serious. A small bite from your house pet is no cause for alarm. However, some animal bites can become infected or injure a bone or other tissue. You must seek medical care if:  The skin is broken and bleeding does not slow down or stop after 15 minutes.  The puncture is deep and difficult to clean (such as a cat bite).  Pain, warmth, redness, or pus develops around the wound.  The bite is from a stray animal or rodent. There may be a risk of rabies infection.  The bite is from a snake, raccoon, skunk, fox, coyote, or bat. There may be a risk of rabies infection.  The person bitten has a chronic illness such as diabetes, liver disease, or cancer, or the person takes medicine that lowers the immune system.  There is concern about the location and severity of the bite. It is important to clean and protect an animal bite wound right away to prevent infection. Follow these steps:  Clean the wound with plenty of water and soap.  Apply an antibiotic cream.  Apply gentle pressure over the wound with a clean towel or gauze to slow or stop bleeding.  Elevate the affected area above the heart to help stop any bleeding.  Seek medical care. Getting medical care within 8 hours of the animal bite leads to the best possible outcome. DIAGNOSIS  Your caregiver will most likely:  Take a detailed history of the animal and the bite  injury.  Perform a wound exam.  Take your medical history. Blood tests or X-rays may be performed. Sometimes, infected bite wounds are cultured and sent to a lab to identify the infectious bacteria.  TREATMENT  Medical treatment will depend on the location and type of animal bite as well as the patient's medical history. Treatment may include:  Wound care, such as cleaning and flushing the wound with saline solution, bandaging, and elevating the affected area.  Antibiotics.  Tetanus immunization.  Rabies immunization.  Leaving the wound open to heal. This is often done with animal bites, due to the high risk of infection. However, in certain cases, wound closure with stitches, wound adhesive, skin adhesive strips, or staples may be used. Infected bites that are left untreated may require intravenous (IV) antibiotics and surgical treatment in the hospital. [place]  Follow your caregiver's instructions for wound care.  Take all medicines as directed.  If your caregiver prescribes antibiotics, take them as directed. Finish them even if you start to feel better.  Follow up with your caregiver for further exams or immunizations as directed. You may need a tetanus shot if:  You cannot remember when you had your last tetanus shot.  You have never had a tetanus shot.  The injury broke your skin. If you get a tetanus shot, your arm may swell, get red, and feel warm to the touch. This is common and  not a problem. If you need a tetanus shot and you choose not to have one, there is a rare chance of getting tetanus. Sickness from tetanus can be serious. SEEK MEDICAL CARE IF:  You notice warmth, redness, soreness, swelling, pus discharge, or a bad smell coming from the wound.  You have a red line on the skin coming from the wound.  You have a fever, chills, or a general ill feeling.  You have nausea or vomiting.  You have continued or worsening pain.  You have  trouble moving the injured part.  You have other questions or concerns. MAKE SURE YOU:  Understand these instructions.  Will watch your condition.  Will get help right away if you are not doing well or get worse. Document Released: 02/24/2011 Document Revised: 08/31/2011 Document Reviewed: 02/24/2011 [facility] Patient Information 2015 [place], [place]. This information is not intended to replace advice given to you by your health care provider. Make sure you discuss any questions you have with your health care provider.

## 2014-06-01 NOTE — ED Provider Notes (Signed)
CSN: [id number]     Arrival date & time 06/01/14  1853 History   First MD Initiated Contact with Patient 06/01/14 1901     Chief Complaint  Patient presents with  . Animal Bite     (Consider location/radiation/quality/duration/timing/severity/associated sxs/prior Treatment) HPI Comments: Patient is a 48 year old male who presents to the emergency department with a dog bite to the left forearm. The patient states that earlier this evening a stray dog came up and started fighting with his dog. He attempted to break the dogs up, and he sustained a bite to the left forearm. He is unsure if bit by   his dog, or the stray. He states that he has not seen this dog before. [place]  and doubts very seriously that he can find the dog that was in question. The patient denies being on any anticoagulation medications. He has not had any previous operations or procedures involving the left forearm. Patient is unsure of the date of his last tetanus. The patient states that his dog is up-to-date on immunizations, but he has no idea of the stray dog that was involved in a fight.  Patient is a 48 y.o. male presenting with animal bite. The history is provided by the patient.  Animal Bite Contact animal:  Dog Time since incident:  2 hours   Past Medical History  Diagnosis Date  . Hiatal hernia   . Chronic gastritis   . GERD (gastroesophageal reflux disease)   . Allergy   . Ulcer    Past Surgical History  Procedure Laterality Date  . Back surgery  2006    l5 s1 fusion  . Inguinal hernia repair  07/20/2011    Procedure: HERNIA REPAIR INGUINAL ADULT;  Surgeon: Scherry Ran, MD;  Location: [facility];  Service: General;  Laterality: Left;  inguinal area  . Upper gastrointestinal endoscopy     Family History  Problem Relation Age of Onset  . Anesthesia problems Neg Hx   . Hypotension Neg Hx   . Malignant hyperthermia Neg Hx   . Pseudochol deficiency Neg Hx   . Colon  cancer Neg Hx   . Esophageal cancer Neg Hx   . Rectal cancer Neg Hx   . Heart disease Father   . Stomach cancer Paternal Grandmother    History  Substance Use Topics  . Smoking status: Never Smoker   . Smokeless tobacco: Never Used  . Alcohol Use: No     Comment: occassional    Review of Systems  Constitutional: Negative for activity change.       All ROS Neg except as noted in HPI  Eyes: Negative for photophobia and discharge.  Respiratory: Negative for cough, shortness of breath and wheezing.   Cardiovascular: Negative for chest pain and palpitations.  Gastrointestinal: Positive for abdominal pain. Negative for blood in stool.  Genitourinary: Negative for dysuria, frequency and hematuria.  Musculoskeletal: Positive for back pain. Negative for arthralgias and neck pain.  Skin: Negative.   Neurological: Negative for dizziness, seizures and speech difficulty.  Psychiatric/Behavioral: Negative for hallucinations and confusion.      Allergies  Review of patient's allergies indicates no known allergies.  Home Medications   Prior to Admission medications   Medication Sig Start Date End Date Taking? Authorizing Provider  etodolac (LODINE) 400 MG tablet Take 400 mg by mouth 2 (two) times daily.    Historical Provider, MD  HYDROcodone-acetaminophen (VICODIN) 5-500 MG per tablet Take  1 tablet by mouth every 6 (six) hours as needed for pain. For pain 07/21/11   Scherry Ran, MD  pantoprazole (PROTONIX) 40 MG tablet Takes one capsule by mouth once daily 04/01/12   Historical Provider, MD   BP 130/81 mmHg  Pulse 71  Temp(Src) 98.7 F (37.1 C) (Oral)  Resp 18  Ht 5\' 10"  (1.778 m)  Wt 166 lb (75.297 kg)  BMI 23.82 kg/m2  SpO2 99% Physical Exam  Constitutional: He is oriented to person, place, and time. He appears well-developed and well-nourished.  Non-toxic appearance.  HENT:  Head: Normocephalic.  Right Ear: Tympanic membrane and external ear normal.  Left Ear:  Tympanic membrane and external ear normal.  Eyes: EOM and lids are normal. Pupils are equal, round, and reactive to light.  Neck: Normal range of motion. Neck supple. Carotid bruit is not present.  Cardiovascular: Normal rate, regular rhythm, normal heart sounds, intact distal pulses and normal pulses.   Pulmonary/Chest: Breath sounds normal. No respiratory distress.  Abdominal: Soft. Bowel sounds are normal. There is no tenderness. There is no guarding.  Musculoskeletal: Normal range of motion.       Arms: There is full range of motion of the left shoulder. There is no deformity of the left bicep or tricep area. There is no weakness involving the flexion and extension of the left forearm and elbow. There is a shallow scratch/laceration of the posterior elbow, no bleeding appreciated. No hematoma is noted. No effusion noted involving the joint. There is a 1.3 cm laceration of the left forearm. Bleeding is controlled. There is no bone or tendon involvement. The radial pulse is 2+. The palmar arch test was passed. Radial pulses 2+ on the left. Capillary refill is less than 2 seconds on the left.  Lymphadenopathy:       Head (right side): No submandibular adenopathy present.       Head (left side): No submandibular adenopathy present.    He has no cervical adenopathy.  Neurological: He is alert and oriented to person, place, and time. He has normal strength. No cranial nerve deficit or sensory deficit.  Skin: Skin is warm and dry.  Psychiatric: He has a normal mood and affect. His speech is normal.  Nursing note and vitals reviewed.   ED Course  Procedures (including critical care time) Labs Review Labs Reviewed - No data to display  Imaging Review No results found.   EKG Interpretation None      MDM  Patient sustained a bite to the left forearm on tonight while breaking up a fight between his dog and another dog. The patient is unsure which dog bit him. He is confident that he cannot  find the straight dog, [place]   Patient's tetanus status was updated, his wound was bandaged, he was started on Augmentin, and his rabies prophylaxis has been started. The patient will return to the specialties clinic for the remainder of his rabies vaccine treatments.   I was called back into the patient's room as the patient has decided not to take the rabies vaccine series. I discussed with the patient the possible dangers of rabies, the recommendations from current medical literature, possible problems including death related to rabies. The patient acknowledges understanding of this decision. Nurse Aldona Bar in the room during my explanation.    Final diagnoses:  Animal bite of forearm, left, initial encounter    **I have reviewed nursing notes, vital signs, and all appropriate lab  and imaging results for this patient.Lenox Ahr, PA-C 06/01/14 [place], PA-C 06/01/14 [place], MD 06/02/14 [phone]

## 2014-06-01 NOTE — ED Notes (Signed)
Patient refused rabies vaccines despite an in depth discussion with Lily Kocher detailing the risks/benefits. Patient did take Tdap, wound cleaned and dressing applied. [facility] deputy in room to speak with patient about incident.

## 2014-06-01 NOTE — ED Notes (Addendum)
Patient reports was bit by a dog tonight when trying to break up a fight between his dog and another dog. Small laceration noted to left forearm, bleeding controlled at this time. States he is unsure which dog bit him. Reports unsure who the other dog belonged to or if the dogs shots are up to date.

## 2016-04-07 DIAGNOSIS — M47816 Spondylosis without myelopathy or radiculopathy, lumbar region: Secondary | ICD-10-CM | POA: Diagnosis not present

## 2016-04-07 DIAGNOSIS — Z79899 Other long term (current) drug therapy: Secondary | ICD-10-CM | POA: Diagnosis not present

## 2016-04-07 DIAGNOSIS — M791 Myalgia: Secondary | ICD-10-CM | POA: Diagnosis not present

## 2016-04-07 DIAGNOSIS — M47896 Other spondylosis, lumbar region: Secondary | ICD-10-CM | POA: Diagnosis not present

## 2016-09-08 DIAGNOSIS — M47896 Other spondylosis, lumbar region: Secondary | ICD-10-CM | POA: Diagnosis not present

## 2016-09-08 DIAGNOSIS — M545 Low back pain: Secondary | ICD-10-CM | POA: Diagnosis not present

## 2016-09-08 DIAGNOSIS — M791 Myalgia: Secondary | ICD-10-CM | POA: Diagnosis not present

## 2016-11-02 DIAGNOSIS — M47816 Spondylosis without myelopathy or radiculopathy, lumbar region: Secondary | ICD-10-CM | POA: Diagnosis not present

## 2016-11-02 DIAGNOSIS — M791 Myalgia: Secondary | ICD-10-CM | POA: Diagnosis not present

## 2017-02-18 DIAGNOSIS — M47896 Other spondylosis, lumbar region: Secondary | ICD-10-CM | POA: Diagnosis not present

## 2017-02-18 DIAGNOSIS — Z79899 Other long term (current) drug therapy: Secondary | ICD-10-CM | POA: Diagnosis not present

## 2017-02-18 DIAGNOSIS — M545 Low back pain: Secondary | ICD-10-CM | POA: Diagnosis not present

## 2017-02-18 DIAGNOSIS — M5106 Intervertebral disc disorders with myelopathy, lumbar region: Secondary | ICD-10-CM | POA: Diagnosis not present

## 2017-03-11 ENCOUNTER — Ambulatory Visit: Payer: Federal, State, Local not specified - PPO | Admitting: Family Medicine

## 2017-05-27 DIAGNOSIS — M47896 Other spondylosis, lumbar region: Secondary | ICD-10-CM | POA: Diagnosis not present

## 2017-05-27 DIAGNOSIS — M791 Myalgia, unspecified site: Secondary | ICD-10-CM | POA: Diagnosis not present

## 2017-05-27 DIAGNOSIS — Z6826 Body mass index (BMI) 26.0-26.9, adult: Secondary | ICD-10-CM | POA: Diagnosis not present

## 2017-07-22 DIAGNOSIS — R05 Cough: Secondary | ICD-10-CM | POA: Diagnosis not present

## 2017-07-22 DIAGNOSIS — J309 Allergic rhinitis, unspecified: Secondary | ICD-10-CM | POA: Diagnosis not present

## 2017-07-22 DIAGNOSIS — K219 Gastro-esophageal reflux disease without esophagitis: Secondary | ICD-10-CM | POA: Diagnosis not present

## 2017-07-29 DIAGNOSIS — R110 Nausea: Secondary | ICD-10-CM | POA: Diagnosis not present

## 2017-07-29 DIAGNOSIS — R1010 Upper abdominal pain, unspecified: Secondary | ICD-10-CM | POA: Diagnosis not present

## 2017-07-29 DIAGNOSIS — K219 Gastro-esophageal reflux disease without esophagitis: Secondary | ICD-10-CM | POA: Diagnosis not present

## 2017-08-04 ENCOUNTER — Ambulatory Visit: Payer: Federal, State, Local not specified - PPO | Admitting: Nurse Practitioner

## 2017-08-04 ENCOUNTER — Encounter: Payer: Self-pay | Admitting: Nurse Practitioner

## 2017-08-04 VITALS — BP 110/70 | HR 68 | Ht 70.0 in | Wt 180.0 lb

## 2017-08-04 DIAGNOSIS — Z1211 Encounter for screening for malignant neoplasm of colon: Secondary | ICD-10-CM | POA: Diagnosis not present

## 2017-08-04 DIAGNOSIS — R112 Nausea with vomiting, unspecified: Secondary | ICD-10-CM

## 2017-08-04 DIAGNOSIS — K219 Gastro-esophageal reflux disease without esophagitis: Secondary | ICD-10-CM | POA: Diagnosis not present

## 2017-08-04 MED ORDER — PEG-KCL-NACL-NASULF-NA ASC-C 140 G PO SOLR: 1.0000 | ORAL | 0 refills | Status: DC

## 2017-08-04 MED ORDER — ONDANSETRON HCL 4 MG PO TABS: 4.0000 mg | ORAL_TABLET | Freq: Three times a day (TID) | ORAL | 0 refills | Status: AC | PRN

## 2017-08-04 NOTE — Progress Notes (Signed)
Chief Complaint: GERD, nausea  Referring Provider:     none  ASSESSMENT AND PLAN;   46. 52 yo male with long-standing history of GERD. Unremarkable upper endoscopy in 2013. Of note he had a normal 24-hour pH probe in 2004. Asymptomatic for years, off treatment. Now with severe chest burning and regurgitation with nausea which is mainly postprandial. EGD would likely be low yield as patient likely has been experiencing GERD symptoms off treatment. The nausea is new and unexplained so will proceed with EGD.  -discussed anti-reflux measures. GERD literature given -recommended wedge pillow  -temporarily will increase proton 40 mg 2 twice a day 30 minutes before meals -EGD at time of screening colonoscopy. The risks and benefits of EGD were discussed with the patient and he agrees to proceed  -zofran 4 mg Q 8 prn  2. Colon cancer screening.  Patient will be scheduled for a screening colonoscopy with possible polypectomy.  The risks and benefits of the procedure were discussed and the patient agrees to proceed.    HPI:     Patient is a 52 yo male, previously followed by Dr. Deatra Ina but not seen since 2013. He has a remote hx of  GERD but has not required medication for the last several years. Over the last 3-4 symptoms his GERD symptoms have returned with such severe burning in chest and regurgitation that he has been unable to work. He tried Gaviscon at home without any improvement. Saw PCP was tried on unknown type of medication which did not help either. No dysphagia. Props with two pillows every night. Goes to bed on an empty stomach. PCP started started Protonix one q am last Thursday and it has helped some. Also having nausea, mostly postprandial. No NSAID use , at least not in the last several months. Roney Jaffe he had ulcers as a child, can't remember how this was diagnosed. Other than PPI, no recent medication changes. No changes in weight. No shortness of breath. Recently he has noticed his  heart pounding at night a couple of times. Heart rate was not fast, just some pounding which spontaneously resolved  No other GI complaints. BM normal. No rectal bleeding. We discussed screening colonoscopy, he is interested    Past Medical History:  Diagnosis Date  . Allergy   . Chronic gastritis   . GERD (gastroesophageal reflux disease)   . Hiatal hernia   . Ulcer      Past Surgical History:  Procedure Laterality Date  . BACK SURGERY  2006   l5 s1 fusion  . INGUINAL HERNIA REPAIR  07/20/2011   Procedure: HERNIA REPAIR INGUINAL ADULT;  Surgeon: Scherry Ran, MD;  Location: [facility];  Service: General;  Laterality: Left;  inguinal area  . UPPER GASTROINTESTINAL ENDOSCOPY     Family History  Problem Relation Age of Onset  . Heart disease Father   . Stomach cancer Paternal Grandmother   . Anesthesia problems Neg Hx   . Hypotension Neg Hx   . Malignant hyperthermia Neg Hx   . Pseudochol deficiency Neg Hx   . Colon cancer Neg Hx   . Esophageal cancer Neg Hx   . Rectal cancer Neg Hx    Social History   Tobacco Use  . Smoking status: Never Smoker  . Smokeless tobacco: Never Used  Substance Use Topics  . Alcohol use: No    Comment: occassional  . Drug use: No   Current Outpatient Medications  Medication Sig Dispense Refill  .  HYDROcodone-acetaminophen (NORCO/VICODIN) 5-325 MG per tablet Take 1 tablet by mouth every 4 (four) hours as needed. 15 tablet 0  . pantoprazole (PROTONIX) 40 MG tablet Takes one capsule by mouth once daily     No current facility-administered medications for this visit.    No Known Allergies   Review of Systems: All systems reviewed and negative except where noted in HPI.    Physical Exam:    BP 110/70   Pulse 68   Ht 5\' 10"  (1.778 m)   Wt 180 lb (81.6 kg)   BMI 25.83 kg/m  Constitutional:  Well-developed, white male in no acute distress. Psychiatric: Normal mood and affect. Behavior is normal. EENT: Pupils normal.   Conjunctivae are normal. No scleral icterus. Neck supple.  Cardiovascular: Normal rate, regular rhythm. No edema Pulmonary/chest: Effort normal and breath sounds normal. No wheezing, rales or rhonchi. Abdominal: Soft, nondistended. Nontender. Bowel sounds active throughout. There are no masses palpable. No hepatomegaly. Lymphadenopathy: No cervical adenopathy noted. Neurological: Alert and oriented to person place and time. Skin: Skin is warm and dry. No rashes noted.  Tye Savoy, NP  08/04/2017, 8:46 AM

## 2017-08-04 NOTE — Addendum Note (Signed)
Addended by: Candie Mile on: 08/04/2017 04:03 PM   Modules accepted: Orders

## 2017-08-04 NOTE — Patient Instructions (Addendum)
If you are age 53 or older, your body mass index should be between 23-30. Your Body mass index is 25.83 kg/m. If this is out of the aforementioned range listed, please consider follow up with your Primary Care Provider.  If you are age 72 or younger, your body mass index should be between 19-25. Your Body mass index is 25.83 kg/m. If this is out of the aformentioned range listed, please consider follow up with your Primary Care Provider.   You have been scheduled for an endoscopy and colonoscopy. Please follow the written instructions given to you at your visit today. Please pick up your prep supplies at the pharmacy within the next 1-3 days. If you use inhalers (even only as needed), please bring them with you on the day of your procedure. Your physician has requested that you go to www.startemmi.com and enter the access code given to you at your visit today. This web site gives a general overview about your procedure. However, you should still follow specific instructions given to you by our office regarding your preparation for the procedure.   We have sent the following medications to your pharmacy for you to pick up at your convenience: Plenvu Protonix   40 mg twice daily 30 minutes before meals. Zofran 4 mg every 8 hours as needed.  Please try and purchase a wedge pillow  You have been given GERD Literature.  Thank you for choosing me and [facility].   Tye Savoy, NP

## 2017-08-05 NOTE — Progress Notes (Signed)
Thank you for sending this case to me. I have reviewed the entire note, and the outlined plan seems appropriate.   Rahn Lacuesta Danis, MD  

## 2017-08-19 ENCOUNTER — Encounter: Payer: Self-pay | Admitting: Gastroenterology

## 2017-08-23 ENCOUNTER — Telehealth: Payer: Self-pay | Admitting: Nurse Practitioner

## 2017-08-23 NOTE — Telephone Encounter (Signed)
Patient states his ins does not cover prep for colon this Wednesday 3.6.19 and wants to know if he can get something else called into [facility] in [place]. Pt had ov 2.13.19

## 2017-08-23 NOTE — Telephone Encounter (Signed)
Checking back on his prep medication for procedure on 08-25-17.

## 2017-08-23 NOTE — Telephone Encounter (Signed)
Contacted patient regarding his prep.  He is coming in the morning to pick up Plenvu prep.

## 2017-08-25 ENCOUNTER — Ambulatory Visit (AMBULATORY_SURGERY_CENTER): Payer: Federal, State, Local not specified - PPO | Admitting: Gastroenterology

## 2017-08-25 ENCOUNTER — Encounter: Payer: Self-pay | Admitting: Gastroenterology

## 2017-08-25 ENCOUNTER — Other Ambulatory Visit: Payer: Self-pay

## 2017-08-25 VITALS — BP 114/66 | HR 52 | Temp 97.3°F | Resp 15 | Ht 70.0 in | Wt 180.0 lb

## 2017-08-25 DIAGNOSIS — Z1211 Encounter for screening for malignant neoplasm of colon: Secondary | ICD-10-CM | POA: Diagnosis present

## 2017-08-25 DIAGNOSIS — K3189 Other diseases of stomach and duodenum: Secondary | ICD-10-CM | POA: Diagnosis not present

## 2017-08-25 DIAGNOSIS — R110 Nausea: Secondary | ICD-10-CM | POA: Diagnosis not present

## 2017-08-25 DIAGNOSIS — D122 Benign neoplasm of ascending colon: Secondary | ICD-10-CM

## 2017-08-25 DIAGNOSIS — D120 Benign neoplasm of cecum: Secondary | ICD-10-CM | POA: Diagnosis not present

## 2017-08-25 DIAGNOSIS — K219 Gastro-esophageal reflux disease without esophagitis: Secondary | ICD-10-CM

## 2017-08-25 MED ORDER — SODIUM CHLORIDE 0.9 % IV SOLN: 500.0000 mL | Freq: Once | INTRAVENOUS | Status: AC

## 2017-08-25 NOTE — Op Note (Signed)
[facility] Patient Name: Jonathon Gray Procedure Date: 08/25/2017 1:19 PM MRN: [id number] Endoscopist: Mallie Mussel L. Loletha Carrow , MD Age: 52 Referring MD:  Date of Birth: [birth year] Gender: Male Account #: [id number] Procedure:                Upper GI endoscopy Indications:              Nausea Medicines:                Monitored Anesthesia Care Procedure:                Pre-Anesthesia Assessment:                           - Prior to the procedure, a History and Physical                            was performed, and patient medications and                            allergies were reviewed. The patient's tolerance of                            previous anesthesia was also reviewed. The risks                            and benefits of the procedure and the sedation                            options and risks were discussed with the patient.                            All questions were answered, and informed consent                            was obtained. Prior Anticoagulants: The patient has                            taken no previous anticoagulant or antiplatelet                            agents. ASA Grade Assessment: II - A patient with                            mild systemic disease. After reviewing the risks                            and benefits, the patient was deemed in                            satisfactory condition to undergo the procedure.                           After obtaining informed consent, the endoscope was  passed under direct vision. Throughout the                            procedure, the patient's blood pressure, pulse, and                            oxygen saturations were monitored continuously. The                            Model GIF-HQ190 [id number]) scope was introduced                            through the mouth, and advanced to the second part                            of duodenum. The upper GI endoscopy was                    accomplished without difficulty. The patient                            tolerated the procedure well. Scope In: Scope Out: Findings:                 The larynx was normal.                           The esophagus was normal.                           Multiple sessile fundic gland polyps were found in                            the gastric body.                           Normal mucosa was found in the entire examined                            stomach. Biopsies were taken with a cold forceps                            for histology. (S[facility]ney protocol).                           The cardia and gastric fundus were normal on                            retroflexion.                           The examined duodenum was normal. Complications:            No immediate complications. Estimated Blood Loss:     Estimated blood loss was minimal. Impression:               - Normal larynx.                           -  Normal esophagus.                           - Multiple fundic gland polyps. Common and benign                            finding.                           - Normal mucosa was found in the entire stomach.                            Biopsied.                           - Normal examined duodenum.                           No cause for nausea seen on this exam. Recommendation:           - Patient has a contact number available for                            emergencies. The signs and symptoms of potential                            delayed complications were discussed with the                            patient. Return to normal activities tomorrow.                            Written discharge instructions were provided to the                            patient.                           - Resume previous diet.                           - Continue present medications.                           - Await pathology results.                           - See the other procedure  note for documentation of                            additional recommendations. Dera Vanaken L. Loletha Carrow, MD 08/25/2017 2:14:26 PM This report has been signed electronically.

## 2017-08-25 NOTE — Progress Notes (Signed)
Called to room to assist during endoscopic procedure.  Patient ID and intended procedure confirmed with present staff. Received instructions for my participation in the procedure from the performing physician.  

## 2017-08-25 NOTE — Patient Instructions (Signed)
Information given on polyps   No ibuprofen, advil, or any NSAIDS for 7 days.   YOU HAD AN ENDOSCOPIC PROCEDURE TODAY AT [facility]:   Refer to the procedure report that was given to you for any specific questions about what was found during the examination.  If the procedure report does not answer your questions, please call your gastroenterologist to clarify.  If you requested that your care partner not be given the details of your procedure findings, then the procedure report has been included in a sealed envelope for you to review at your convenience later.  YOU SHOULD EXPECT: Some feelings of bloating in the abdomen. Passage of more gas than usual.  Walking can help get rid of the air that was put into your GI tract during the procedure and reduce the bloating. If you had a lower endoscopy (such as a colonoscopy or flexible sigmoidoscopy) you may notice spotting of blood in your stool or on the toilet paper. If you underwent a bowel prep for your procedure, you may not have a normal bowel movement for a few days.  Please Note:  You might notice some irritation and congestion in your nose or some drainage.  This is from the oxygen used during your procedure.  There is no need for concern and it should clear up in a day or so.  SYMPTOMS TO REPORT IMMEDIATELY:   Following lower endoscopy (colonoscopy or flexible sigmoidoscopy):  Excessive amounts of blood in the stool  Significant tenderness or worsening of abdominal pains  Swelling of the abdomen that is new, acute  Fever of 100F or higher   Following upper endoscopy (EGD)  Vomiting of blood or coffee ground material  New chest pain or pain under the shoulder blades  Painful or persistently difficult swallowing  New shortness of breath  Fever of 100F or higher  Black, tarry-looking stools  For urgent or emergent issues, a gastroenterologist can be reached at any hour by calling [phone].   DIET:  We do  recommend a small meal at first, but then you may proceed to your regular diet.  Drink plenty of fluids but you should avoid alcoholic beverages for 24 hours.  ACTIVITY:  You should plan to take it easy for the rest of today and you should NOT DRIVE or use heavy machinery until tomorrow (because of the sedation medicines used during the test).    FOLLOW UP: Our staff will call the number listed on your records the next business day following your procedure to check on you and address any questions or concerns that you may have regarding the information given to you following your procedure. If we do not reach you, we will leave a message.  However, if you are feeling well and you are not experiencing any problems, there is no need to return our call.  We will assume that you have returned to your regular daily activities without incident.  If any biopsies were taken you will be contacted by phone or by letter within the next 1-3 weeks.  Please call us at [phone] if you have not heard about the biopsies in 3 weeks.    SIGNATURES/CONFIDENTIALITY: You and/or your care partner have signed paperwork which will be entered into your electronic medical record.  These signatures attest to the fact that that the information above on your After Visit Summary has been reviewed and is understood.  Full responsibility of the confidentiality of this discharge information lies  with you and/or your care-partner.

## 2017-08-25 NOTE — Op Note (Signed)
[facility] Patient Name: Jonathon Gray Procedure Date: 08/25/2017 1:18 PM MRN: [id number] Endoscopist: Mallie Mussel L. Loletha Carrow , MD Age: 52 Referring MD:  Date of Birth: [birth year] Gender: Male Account #: [id number] Procedure:                Colonoscopy Indications:              Screening for colorectal malignant neoplasm, This                            is the patient's first colonoscopy Medicines:                Monitored Anesthesia Care Procedure:                Pre-Anesthesia Assessment:                           - Prior to the procedure, a History and Physical                            was performed, and patient medications and                            allergies were reviewed. The patient's tolerance of                            previous anesthesia was also reviewed. The risks                            and benefits of the procedure and the sedation                            options and risks were discussed with the patient.                            All questions were answered, and informed consent                            was obtained. Prior Anticoagulants: The patient has                            taken no previous anticoagulant or antiplatelet                            agents. ASA Grade Assessment: II - A patient with                            mild systemic disease. After reviewing the risks                            and benefits, the patient was deemed in                            satisfactory condition to undergo the procedure.  After obtaining informed consent, the colonoscope                            was passed under direct vision. Throughout the                            procedure, the patient's blood pressure, pulse, and                            oxygen saturations were monitored continuously. The                            Colonoscope was introduced through the anus and                            advanced to the the cecum,  identified by                            appendiceal orifice and ileocecal valve. The                            colonoscopy was performed without difficulty. The                            patient tolerated the procedure well. The quality                            of the bowel preparation was excellent. The                            ileocecal valve, appendiceal orifice, and rectum                            were photographed. Scope In: 1:33:12 PM Scope Out: 2:08:54 PM Scope Withdrawal Time: 0 hours 32 minutes 54 seconds  Total Procedure Duration: 0 hours 35 minutes 42 seconds  Findings:                 The perianal and digital rectal examinations were                            normal.                           A 12 mm polyp was found in the cecum. The polyp was                            semi-sessile. The polyp was removed with a                            piecemeal technique using a cold snare. Resection                            and retrieval were complete.  A 8 mm polyp was found in the proximal ascending                            colon. The polyp was sessile. The polyp was removed                            with a cold snare. Resection and retrieval were                            complete.                           A 20 mm x 60mm polyp was found in the distal                            ascending colon. The polyp was flat.                           The exam was otherwise without abnormality on                            direct and retroflexion views. Complications:            No immediate complications. Estimated Blood Loss:     Estimated blood loss was minimal. Impression:               - One 12 mm polyp in the cecum, removed piecemeal                            using a cold snare. Resected and retrieved.                           - One 8 mm polyp in the proximal ascending colon,                            removed with a cold snare. Resected and  retrieved.                           - One 20 mm x 77mm polyp in the distal ascending                            colon, removed piecemeal using a hot snare.                            Resected and retrieved.                           - The examination was otherwise normal on direct                            and retroflexion views. Recommendation:           - Patient has a contact number available for  emergencies. The signs and symptoms of potential                            delayed complications were discussed with the                            patient. Return to normal activities tomorrow.                            Written discharge instructions were provided to the                            patient.                           - Resume previous diet.                           - No aspirin, ibuprofen, naproxen, or other                            non-steroidal anti-inflammatory drugs for 7 days                            after polyp removal.                           - Await pathology results.                           - Repeat colonoscopy is recommended for                            surveillance. The colonoscopy date will be                            determined after pathology results from today's                            exam become available for review. Henry L. Loletha Carrow, MD 08/25/2017 2:18:26 PM This report has been signed electronically.

## 2017-08-25 NOTE — Progress Notes (Signed)
To PACU, VSS. Report to [email or web address] 

## 2017-08-26 ENCOUNTER — Telehealth: Payer: Self-pay

## 2017-08-26 ENCOUNTER — Telehealth: Payer: Self-pay | Admitting: *Deleted

## 2017-08-26 NOTE — Telephone Encounter (Signed)
No answer. No identifier. Message left to call if questions or concerns and we would make an attempt to call later in the day.

## 2017-08-26 NOTE — Telephone Encounter (Signed)
Left message

## 2017-08-31 ENCOUNTER — Encounter: Payer: Self-pay | Admitting: Gastroenterology

## 2017-10-13 DIAGNOSIS — J209 Acute bronchitis, unspecified: Secondary | ICD-10-CM | POA: Diagnosis not present

## 2017-10-13 DIAGNOSIS — K210 Gastro-esophageal reflux disease with esophagitis: Secondary | ICD-10-CM | POA: Diagnosis not present

## 2017-10-13 DIAGNOSIS — J309 Allergic rhinitis, unspecified: Secondary | ICD-10-CM | POA: Diagnosis not present

## 2017-10-18 ENCOUNTER — Ambulatory Visit: Payer: Federal, State, Local not specified - PPO | Admitting: Gastroenterology

## 2017-11-18 DIAGNOSIS — M791 Myalgia, unspecified site: Secondary | ICD-10-CM | POA: Diagnosis not present

## 2017-11-18 DIAGNOSIS — Z79899 Other long term (current) drug therapy: Secondary | ICD-10-CM | POA: Diagnosis not present

## 2017-11-18 DIAGNOSIS — M47892 Other spondylosis, cervical region: Secondary | ICD-10-CM | POA: Diagnosis not present

## 2017-11-18 DIAGNOSIS — M542 Cervicalgia: Secondary | ICD-10-CM | POA: Diagnosis not present

## 2018-01-19 DIAGNOSIS — M545 Low back pain: Secondary | ICD-10-CM | POA: Diagnosis not present

## 2018-01-19 DIAGNOSIS — M5106 Intervertebral disc disorders with myelopathy, lumbar region: Secondary | ICD-10-CM | POA: Diagnosis not present

## 2018-01-19 DIAGNOSIS — G894 Chronic pain syndrome: Secondary | ICD-10-CM | POA: Diagnosis not present

## 2018-01-19 DIAGNOSIS — M47892 Other spondylosis, cervical region: Secondary | ICD-10-CM | POA: Diagnosis not present

## 2018-02-16 DIAGNOSIS — M545 Low back pain: Secondary | ICD-10-CM | POA: Diagnosis not present

## 2018-02-16 DIAGNOSIS — M791 Myalgia, unspecified site: Secondary | ICD-10-CM | POA: Diagnosis not present

## 2018-02-16 DIAGNOSIS — M5106 Intervertebral disc disorders with myelopathy, lumbar region: Secondary | ICD-10-CM | POA: Diagnosis not present

## 2018-02-16 DIAGNOSIS — G894 Chronic pain syndrome: Secondary | ICD-10-CM | POA: Diagnosis not present

## 2018-02-18 ENCOUNTER — Other Ambulatory Visit: Payer: Self-pay | Admitting: Orthopaedic Surgery

## 2018-02-18 DIAGNOSIS — M5106 Intervertebral disc disorders with myelopathy, lumbar region: Secondary | ICD-10-CM

## 2018-02-23 ENCOUNTER — Ambulatory Visit
Admission: RE | Admit: 2018-02-23 | Discharge: 2018-02-23 | Disposition: A | Discharge status: Home / Self Care | Payer: Federal, State, Local not specified - PPO | Source: Ambulatory Visit | Attending: Orthopaedic Surgery | Admitting: Orthopaedic Surgery

## 2018-02-23 DIAGNOSIS — M5127 Other intervertebral disc displacement, lumbosacral region: Secondary | ICD-10-CM | POA: Diagnosis not present

## 2018-02-23 DIAGNOSIS — M5106 Intervertebral disc disorders with myelopathy, lumbar region: Secondary | ICD-10-CM

## 2018-02-24 DIAGNOSIS — M545 Low back pain: Secondary | ICD-10-CM | POA: Diagnosis not present

## 2018-02-24 DIAGNOSIS — M5126 Other intervertebral disc displacement, lumbar region: Secondary | ICD-10-CM | POA: Diagnosis not present

## 2018-02-24 DIAGNOSIS — M5416 Radiculopathy, lumbar region: Secondary | ICD-10-CM | POA: Diagnosis not present

## 2018-02-24 DIAGNOSIS — G894 Chronic pain syndrome: Secondary | ICD-10-CM | POA: Diagnosis not present

## 2018-03-01 DIAGNOSIS — M5416 Radiculopathy, lumbar region: Secondary | ICD-10-CM | POA: Diagnosis not present

## 2018-03-22 DIAGNOSIS — M5106 Intervertebral disc disorders with myelopathy, lumbar region: Secondary | ICD-10-CM | POA: Diagnosis not present

## 2018-03-22 DIAGNOSIS — M5416 Radiculopathy, lumbar region: Secondary | ICD-10-CM | POA: Diagnosis not present

## 2018-03-22 DIAGNOSIS — M545 Low back pain: Secondary | ICD-10-CM | POA: Diagnosis not present

## 2018-04-04 DIAGNOSIS — M5136 Other intervertebral disc degeneration, lumbar region: Secondary | ICD-10-CM | POA: Diagnosis not present

## 2018-05-25 DIAGNOSIS — M545 Low back pain: Secondary | ICD-10-CM | POA: Diagnosis not present

## 2018-05-25 DIAGNOSIS — M5416 Radiculopathy, lumbar region: Secondary | ICD-10-CM | POA: Diagnosis not present

## 2018-05-25 DIAGNOSIS — M5106 Intervertebral disc disorders with myelopathy, lumbar region: Secondary | ICD-10-CM | POA: Diagnosis not present

## 2018-05-25 DIAGNOSIS — Z6826 Body mass index (BMI) 26.0-26.9, adult: Secondary | ICD-10-CM | POA: Diagnosis not present

## 2018-05-25 DIAGNOSIS — Z79899 Other long term (current) drug therapy: Secondary | ICD-10-CM | POA: Diagnosis not present

## 2018-05-31 DIAGNOSIS — M5416 Radiculopathy, lumbar region: Secondary | ICD-10-CM | POA: Diagnosis not present

## 2018-06-29 DIAGNOSIS — M545 Low back pain: Secondary | ICD-10-CM | POA: Diagnosis not present

## 2018-06-29 DIAGNOSIS — Z6826 Body mass index (BMI) 26.0-26.9, adult: Secondary | ICD-10-CM | POA: Diagnosis not present

## 2018-06-29 DIAGNOSIS — M5106 Intervertebral disc disorders with myelopathy, lumbar region: Secondary | ICD-10-CM | POA: Diagnosis not present

## 2018-06-29 DIAGNOSIS — M5416 Radiculopathy, lumbar region: Secondary | ICD-10-CM | POA: Diagnosis not present

## 2018-10-17 DIAGNOSIS — M5416 Radiculopathy, lumbar region: Secondary | ICD-10-CM | POA: Diagnosis not present

## 2018-11-15 DIAGNOSIS — M5106 Intervertebral disc disorders with myelopathy, lumbar region: Secondary | ICD-10-CM | POA: Diagnosis not present

## 2018-11-15 DIAGNOSIS — M5416 Radiculopathy, lumbar region: Secondary | ICD-10-CM | POA: Diagnosis not present

## 2018-11-15 DIAGNOSIS — M545 Low back pain: Secondary | ICD-10-CM | POA: Diagnosis not present

## 2018-11-21 ENCOUNTER — Other Ambulatory Visit: Payer: Self-pay | Admitting: Rehabilitation

## 2018-11-21 DIAGNOSIS — M5416 Radiculopathy, lumbar region: Secondary | ICD-10-CM

## 2018-12-06 ENCOUNTER — Ambulatory Visit
Admission: RE | Admit: 2018-12-06 | Discharge: 2018-12-06 | Disposition: A | Discharge status: Home / Self Care | Payer: Federal, State, Local not specified - PPO | Source: Ambulatory Visit | Attending: Rehabilitation | Admitting: Rehabilitation

## 2018-12-06 ENCOUNTER — Other Ambulatory Visit: Payer: Self-pay

## 2018-12-06 DIAGNOSIS — M5116 Intervertebral disc disorders with radiculopathy, lumbar region: Secondary | ICD-10-CM | POA: Diagnosis not present

## 2018-12-06 DIAGNOSIS — Z981 Arthrodesis status: Secondary | ICD-10-CM | POA: Diagnosis not present

## 2018-12-06 DIAGNOSIS — M5416 Radiculopathy, lumbar region: Secondary | ICD-10-CM

## 2018-12-06 MED ORDER — GADOBENATE DIMEGLUMINE 529 MG/ML IV SOLN
17.0000 mL | Freq: Once | INTRAVENOUS | Status: AC | PRN
  Administered 2018-12-06: 17 mL via INTRAVENOUS

## 2018-12-12 DIAGNOSIS — M5106 Intervertebral disc disorders with myelopathy, lumbar region: Secondary | ICD-10-CM | POA: Diagnosis not present

## 2018-12-12 DIAGNOSIS — M5416 Radiculopathy, lumbar region: Secondary | ICD-10-CM | POA: Diagnosis not present

## 2018-12-12 DIAGNOSIS — M545 Low back pain: Secondary | ICD-10-CM | POA: Diagnosis not present

## 2018-12-12 DIAGNOSIS — Z79899 Other long term (current) drug therapy: Secondary | ICD-10-CM | POA: Diagnosis not present

## 2018-12-19 DIAGNOSIS — M47816 Spondylosis without myelopathy or radiculopathy, lumbar region: Secondary | ICD-10-CM | POA: Diagnosis not present

## 2019-01-09 DIAGNOSIS — M47816 Spondylosis without myelopathy or radiculopathy, lumbar region: Secondary | ICD-10-CM | POA: Diagnosis not present

## 2019-01-11 ENCOUNTER — Other Ambulatory Visit: Payer: Federal, State, Local not specified - PPO

## 2019-01-11 ENCOUNTER — Other Ambulatory Visit: Payer: Self-pay

## 2019-01-11 DIAGNOSIS — Z20822 Contact with and (suspected) exposure to covid-19: Secondary | ICD-10-CM

## 2019-01-11 DIAGNOSIS — R6889 Other general symptoms and signs: Secondary | ICD-10-CM | POA: Diagnosis not present

## 2019-01-15 LAB — NOVEL CORONAVIRUS, NAA: SARS-CoV-2, NAA: NOT DETECTED

## 2019-01-20 ENCOUNTER — Telehealth: Payer: Self-pay

## 2019-01-20 NOTE — Telephone Encounter (Signed)
Mailed COVID results to pt's home address, per his request.

## 2019-02-28 DIAGNOSIS — M47816 Spondylosis without myelopathy or radiculopathy, lumbar region: Secondary | ICD-10-CM | POA: Diagnosis not present

## 2019-05-24 DIAGNOSIS — M47816 Spondylosis without myelopathy or radiculopathy, lumbar region: Secondary | ICD-10-CM | POA: Diagnosis not present

## 2019-05-24 DIAGNOSIS — M545 Low back pain: Secondary | ICD-10-CM | POA: Diagnosis not present

## 2019-06-28 ENCOUNTER — Other Ambulatory Visit: Payer: Self-pay

## 2019-06-28 ENCOUNTER — Ambulatory Visit: Payer: Federal, State, Local not specified - PPO | Attending: Internal Medicine

## 2019-06-28 DIAGNOSIS — Z20822 Contact with and (suspected) exposure to covid-19: Secondary | ICD-10-CM | POA: Diagnosis not present

## 2019-06-30 LAB — NOVEL CORONAVIRUS, NAA: SARS-CoV-2, NAA: NOT DETECTED

## 2019-08-17 DIAGNOSIS — Z79899 Other long term (current) drug therapy: Secondary | ICD-10-CM | POA: Diagnosis not present

## 2019-08-17 DIAGNOSIS — M47816 Spondylosis without myelopathy or radiculopathy, lumbar region: Secondary | ICD-10-CM | POA: Diagnosis not present

## 2019-08-17 DIAGNOSIS — M545 Low back pain: Secondary | ICD-10-CM | POA: Diagnosis not present

## 2019-08-17 DIAGNOSIS — M5106 Intervertebral disc disorders with myelopathy, lumbar region: Secondary | ICD-10-CM | POA: Diagnosis not present

## 2020-04-23 IMAGING — MR MR LUMBAR SPINE W/O CM
5 series · 45 of 48 positions shown · non-contrast
Comparison: Prior MRI from 09/23/2007.

CLINICAL DATA: Initial evaluation for bilateral flank and posterior
leg pain for 2 months. History of prior surgery in 2337.

EXAM:
MRI LUMBAR SPINE WITHOUT CONTRAST
TECHNIQUE: Multiplanar, multisequence MR imaging of the lumbar spine was
performed. No intravenous contrast was administered.

[Series 3: T2 post-contrast · sagittal · 4.0mm · 0.88mm/px · 6 of 13 slices shown]
[im 1/13]
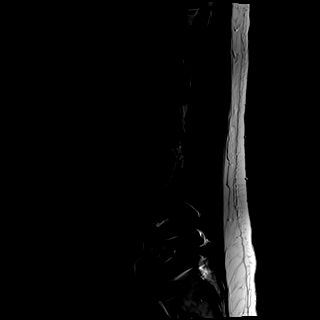
[im 3/13]
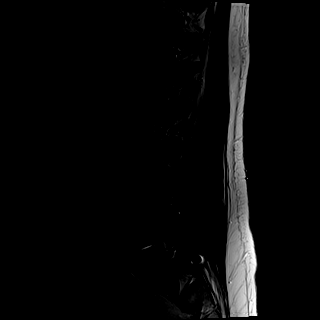
[im 5/13]
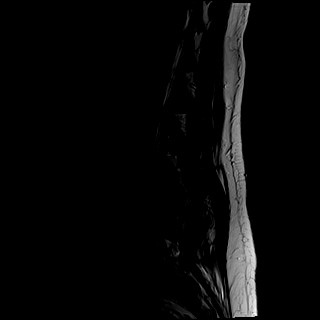
[im 8/13]
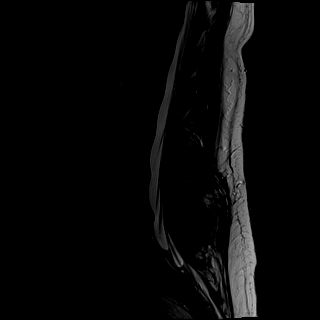
[im 10/13]
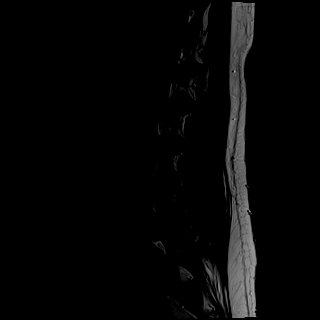
[im 13/13]
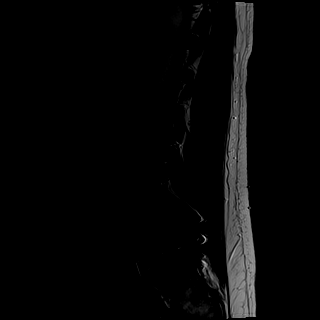

[Series 4: T1 · sagittal · 4.0mm · 0.88mm/px · 5 of 13 slices shown (1 of 2)]
[im 1/13]
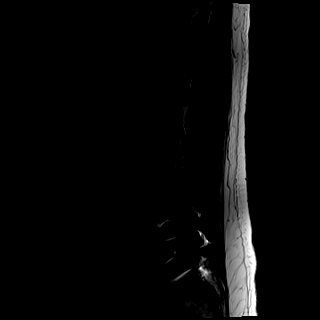
[im 4/13]
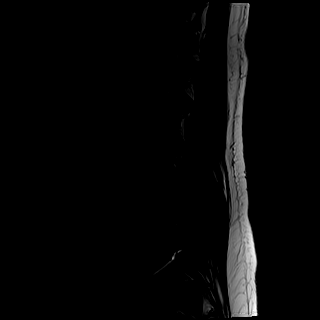
[im 7/13]
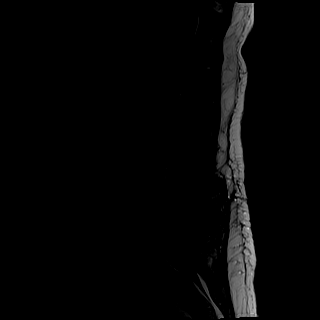
[im 10/13]
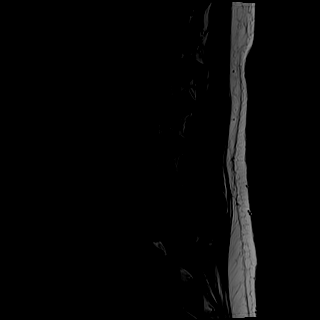
[im 13/13]
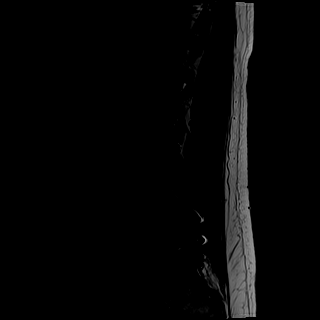

[Series 5: tirm sag · sagittal · 4.0mm · 0.55mm/px · 5 of 13 slices shown]
[im 1/13]
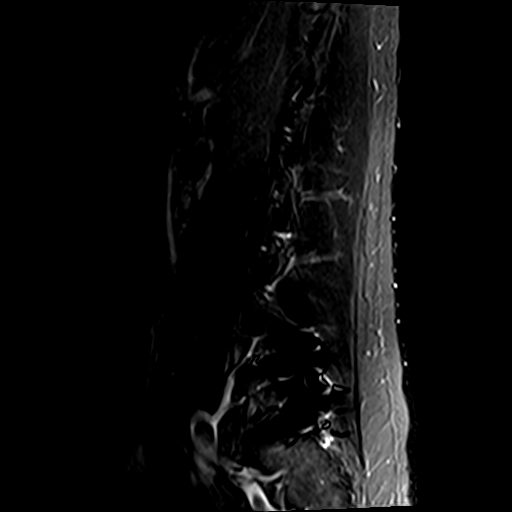
[im 4/13]
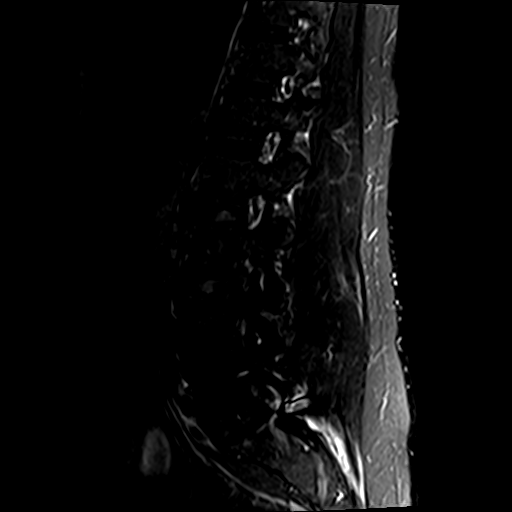
[im 7/13]
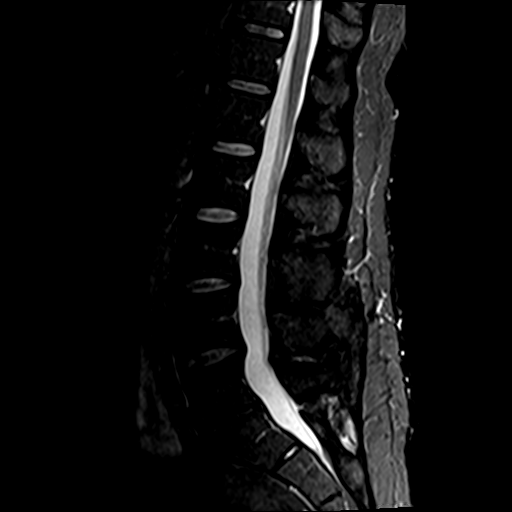
[im 10/13]
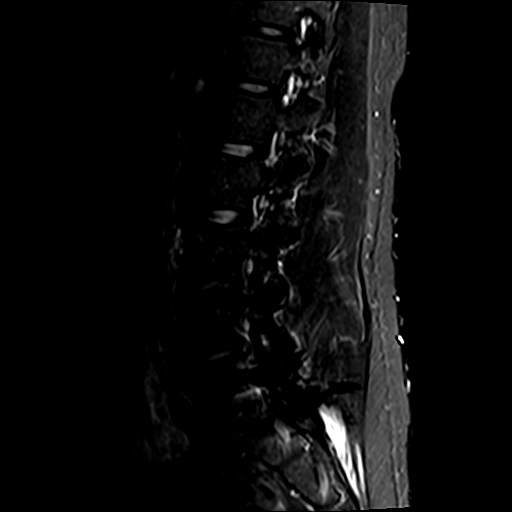
[im 13/13]
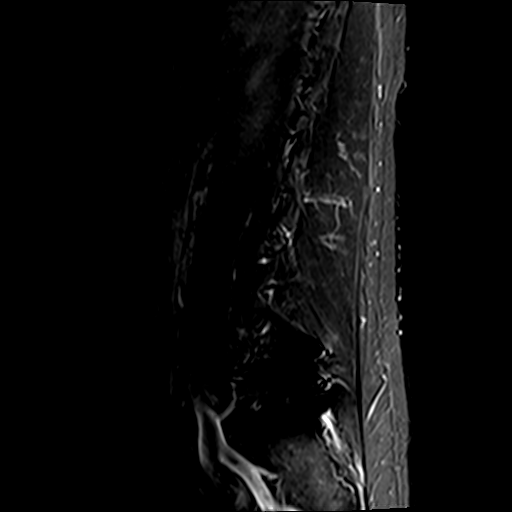

[Series 6: T1 · axial · 4.0mm · 0.78mm/px · z∈[-51,+177]mm · 13 of 41 slices shown (2 of 2)]
[im 1/41]
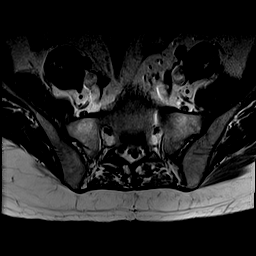
[im 3/41]
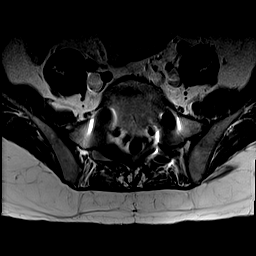
[im 6/41]
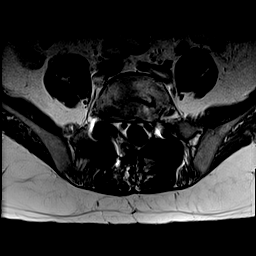
[im 9/41]
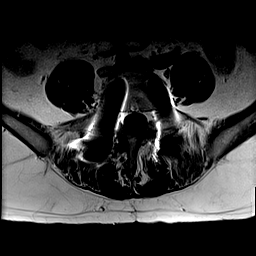
[im 11/41]
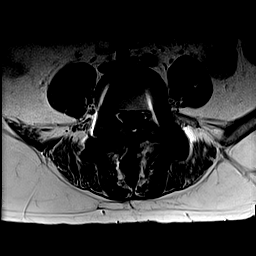
[im 14/41]
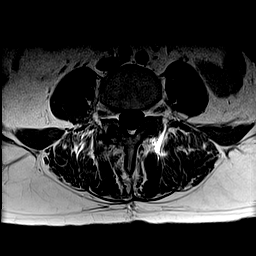
[im 17/41]
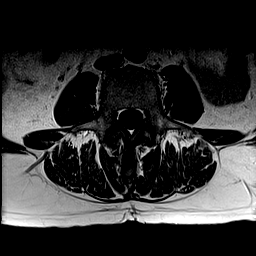
[im 19/41]
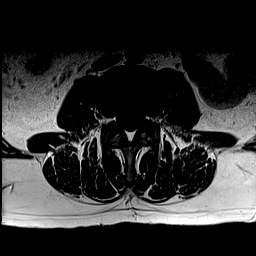
[im 22/41]
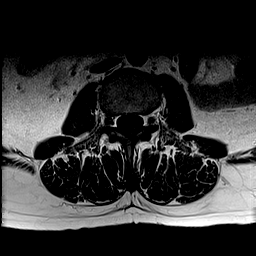
[im 25/41]
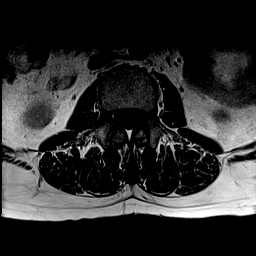
[im 30/41]
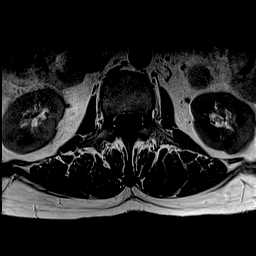
[im 35/41]
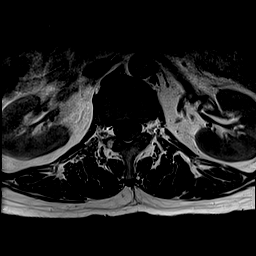
[im 41/41]
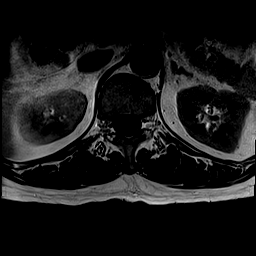

[Series 7: T2 · axial · 4.0mm · 0.78mm/px · z∈[-51,+177]mm · 16 of 41 slices shown]
[im 1/41]
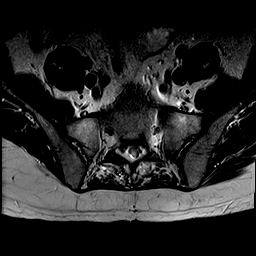
[im 3/41]
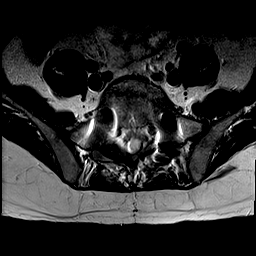
[im 6/41]
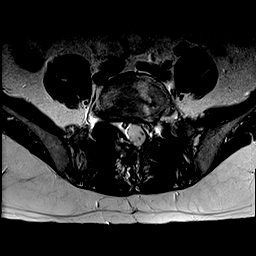
[im 9/41]
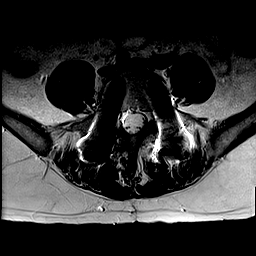
[im 11/41]
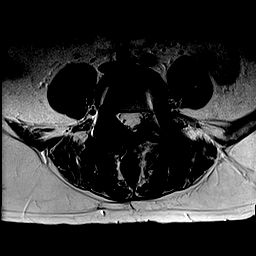
[im 14/41]
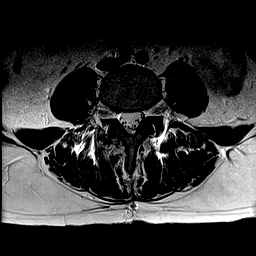
[im 17/41]
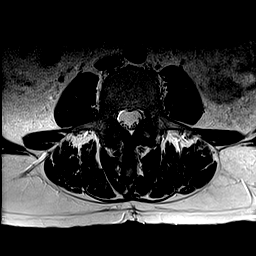
[im 19/41]
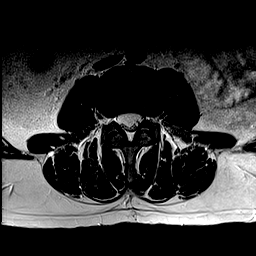
[im 22/41]
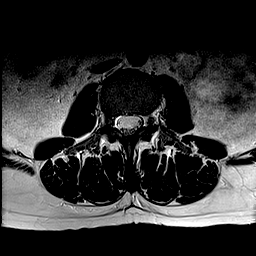
[im 25/41]
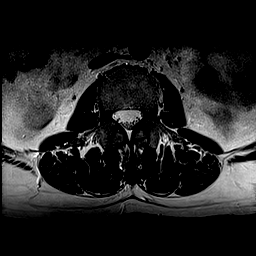
[im 27/41]
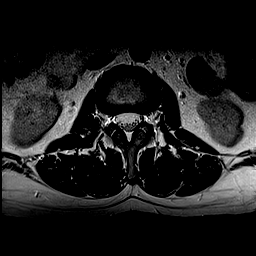
[im 30/41]
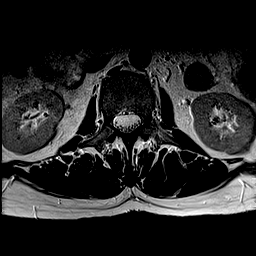
[im 33/41]
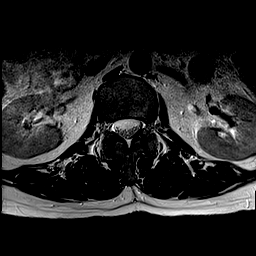
[im 35/41]
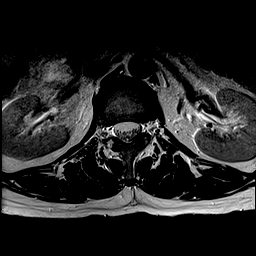
[im 38/41]
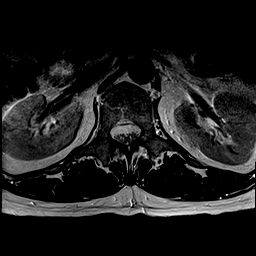
[im 41/41]
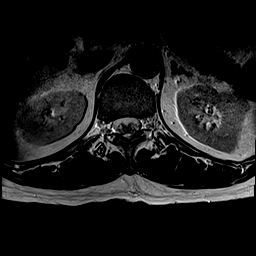

[45 of 48 positions shown; findings below may reference images not displayed]

FINDINGS: Segmentation: Normal segmentation. Lowest well-formed disc labeled
the L5-S1 level.

Alignment: Trace retrolisthesis of L4 on L5. Alignment otherwise
normal with preservation of the normal lumbar lordosis.

Vertebrae: Postoperative changes from prior PLIF at L5-S1. Vertebral
body heights well maintained without evidence for acute or chronic
fracture. Bone marrow signal intensity normal. No discrete or
worrisome osseous lesions. No abnormal marrow edema.

Conus medullaris and cauda equina: Conus extends to the L1 level.
Conus and cauda equina appear normal.

Paraspinal and other soft tissues: Remote postoperative changes
present within the lower paraspinous soft tissues. Paraspinous soft
tissues demonstrate no acute finding. Visualized visceral structures
are within normal limits.

Disc levels:

L1-2:  Unremarkable.

L2-3:  Unremarkable.

L3-4: Mild annular disc bulge. Mild facet and ligament flavum
hypertrophy. No significant canal or foraminal stenosis.

L4-5: Trace retrolisthesis. Mild diffuse disc bulge with disc
desiccation. Superimposed shallow right foraminal disc protrusion
closely approximates the exiting right L4 nerve root (series 7,
image 29). Mild to moderate facet hypertrophy. No significant canal
stenosis. Mild right L4 foraminal narrowing.

L5-S1: Prior posterior and interbody fusion with posterior
decompression. Thecal sac widely patent without residual or
recurrent canal or lateral recess stenosis. Foramina appear patent.
IMPRESSION: 1. Prior PLIF at L5-S1 without residual or recurrent stenosis.
2. Shallow right foraminal disc protrusion at L4-5, closely
approximating and potentially irritating the exiting right L4 nerve
root.
3. Minor noncompressive disc bulging at L3-4 without stenosis.
4. Mild to moderate bilateral facet hypertrophy at L3-4 and L4-5.

## 2021-02-04 ENCOUNTER — Encounter: Payer: Self-pay | Admitting: Gastroenterology
